# Patient Record
Sex: Female | Born: 1945 | Race: White | Hispanic: No | Marital: Married | Smoking: Former smoker
Health system: Southern US, Community
[De-identification: ages and names within clinical notes are randomized; demographics above are authoritative.]

## PROBLEM LIST (undated history)

## (undated) DIAGNOSIS — E7800 Pure hypercholesterolemia, unspecified: Secondary | ICD-10-CM

## (undated) DIAGNOSIS — C3490 Malignant neoplasm of unspecified part of unspecified bronchus or lung: Secondary | ICD-10-CM

## (undated) DIAGNOSIS — H409 Unspecified glaucoma: Secondary | ICD-10-CM

## (undated) DIAGNOSIS — I10 Essential (primary) hypertension: Secondary | ICD-10-CM

## (undated) HISTORY — PX: BREAST SURGERY: SHX581

## (undated) HISTORY — PX: HERNIA REPAIR: SHX51

---

## 1997-05-27 ENCOUNTER — Ambulatory Visit (HOSPITAL_COMMUNITY): Admission: RE | Admit: 1997-05-27 | Discharge: 1997-05-27 | Payer: Self-pay | Admitting: Family Medicine

## 1998-06-05 ENCOUNTER — Ambulatory Visit (HOSPITAL_COMMUNITY): Admission: RE | Admit: 1998-06-05 | Discharge: 1998-06-05 | Payer: Self-pay

## 1998-10-18 ENCOUNTER — Other Ambulatory Visit: Admission: RE | Admit: 1998-10-18 | Discharge: 1998-10-18 | Payer: Self-pay | Admitting: *Deleted

## 1999-07-11 ENCOUNTER — Ambulatory Visit (HOSPITAL_COMMUNITY): Admission: RE | Admit: 1999-07-11 | Discharge: 1999-07-11 | Payer: Self-pay | Admitting: Family Medicine

## 1999-07-11 ENCOUNTER — Encounter: Payer: Self-pay | Admitting: Family Medicine

## 2000-08-05 ENCOUNTER — Ambulatory Visit (HOSPITAL_COMMUNITY): Admission: RE | Admit: 2000-08-05 | Discharge: 2000-08-05 | Payer: Self-pay | Admitting: Family Medicine

## 2000-08-05 ENCOUNTER — Encounter: Payer: Self-pay | Admitting: Family Medicine

## 2001-08-07 ENCOUNTER — Encounter: Admission: RE | Admit: 2001-08-07 | Discharge: 2001-08-07 | Payer: Self-pay | Admitting: Family Medicine

## 2001-08-07 ENCOUNTER — Encounter: Payer: Self-pay | Admitting: Family Medicine

## 2001-09-10 ENCOUNTER — Ambulatory Visit (HOSPITAL_COMMUNITY): Admission: RE | Admit: 2001-09-10 | Discharge: 2001-09-10 | Payer: Self-pay | Admitting: Family Medicine

## 2001-09-10 ENCOUNTER — Encounter: Payer: Self-pay | Admitting: Family Medicine

## 2001-10-15 ENCOUNTER — Encounter: Admission: RE | Admit: 2001-10-15 | Discharge: 2001-10-15 | Payer: Self-pay | Admitting: Family Medicine

## 2001-10-15 ENCOUNTER — Encounter: Payer: Self-pay | Admitting: Family Medicine

## 2002-08-13 ENCOUNTER — Encounter: Admission: RE | Admit: 2002-08-13 | Discharge: 2002-08-13 | Payer: Self-pay | Admitting: Family Medicine

## 2002-08-13 ENCOUNTER — Encounter: Payer: Self-pay | Admitting: Family Medicine

## 2003-05-12 ENCOUNTER — Other Ambulatory Visit: Admission: RE | Admit: 2003-05-12 | Discharge: 2003-05-12 | Payer: Self-pay | Admitting: Internal Medicine

## 2003-06-29 ENCOUNTER — Ambulatory Visit (HOSPITAL_COMMUNITY): Admission: RE | Admit: 2003-06-29 | Discharge: 2003-06-29 | Payer: Self-pay | Admitting: Gastroenterology

## 2003-06-29 ENCOUNTER — Encounter (INDEPENDENT_AMBULATORY_CARE_PROVIDER_SITE_OTHER): Payer: Self-pay | Admitting: Specialist

## 2003-09-20 ENCOUNTER — Ambulatory Visit (HOSPITAL_COMMUNITY): Admission: RE | Admit: 2003-09-20 | Discharge: 2003-09-20 | Payer: Self-pay | Admitting: Internal Medicine

## 2004-09-25 ENCOUNTER — Ambulatory Visit (HOSPITAL_COMMUNITY): Admission: RE | Admit: 2004-09-25 | Discharge: 2004-09-25 | Payer: Self-pay | Admitting: Internal Medicine

## 2004-11-09 ENCOUNTER — Other Ambulatory Visit: Admission: RE | Admit: 2004-11-09 | Discharge: 2004-11-09 | Payer: Self-pay | Admitting: Internal Medicine

## 2004-12-12 ENCOUNTER — Ambulatory Visit (HOSPITAL_COMMUNITY): Admission: RE | Admit: 2004-12-12 | Discharge: 2004-12-12 | Payer: Self-pay | Admitting: Gastroenterology

## 2004-12-12 ENCOUNTER — Encounter (INDEPENDENT_AMBULATORY_CARE_PROVIDER_SITE_OTHER): Payer: Self-pay | Admitting: *Deleted

## 2007-08-27 ENCOUNTER — Encounter: Admission: RE | Admit: 2007-08-27 | Discharge: 2007-08-27 | Payer: Self-pay | Admitting: Surgery

## 2007-09-08 ENCOUNTER — Encounter: Admission: RE | Admit: 2007-09-08 | Discharge: 2007-09-08 | Payer: Self-pay | Admitting: Surgery

## 2009-12-07 ENCOUNTER — Ambulatory Visit (HOSPITAL_COMMUNITY): Admission: RE | Admit: 2009-12-07 | Discharge: 2009-12-07 | Payer: Self-pay | Admitting: Gastroenterology

## 2010-03-09 ENCOUNTER — Other Ambulatory Visit
Admission: RE | Admit: 2010-03-09 | Discharge: 2010-03-09 | Payer: Self-pay | Source: Home / Self Care | Admitting: Family Medicine

## 2010-07-20 NOTE — Op Note (Signed)
NAMEDALLANA, MAVITY                        ACCOUNT NO.:  [id number]   MEDICAL RECORD NO.:  [id number]                   PATIENT TYPE:  AMB   LOCATION:  ENDO                                 FACILITY:  [facility]   PHYSICIAN:  Danise Edge, M.D.                DATE OF BIRTH:  Oct 08, 1945   DATE OF PROCEDURE:  06/29/2003  DATE OF DISCHARGE:                                 OPERATIVE REPORT   PROCEDURE:  Colonoscopy with polypectomy.   INDICATIONS:  Mrs. Lori Park is a 65 year old female, born December 27, 1945.  Mrs. Lori Park is scheduled to undergo a screening colonoscopy  with polypectomy to prevent colon cancer.   ENDOSCOPIST:  Danise Edge, M.D.   PREMEDICATION:  Versed 5 mg, Demerol 50. Mg.   DESCRIPTION OF PROCEDURE:  After obtaining informed consent, Mrs. Call  was placed in the left lateral decubitus position.  I administered  intravenous Demerol and intravenous Versed to achieve conscious sedation for  the procedure.  The patient's blood pressure, oxygen saturation and cardiac  rhythm were monitored throughout the procedure and documented in the medical  record.   Anal inspection and digital rectal exam was normal.  The Olympus adjustable  pediatric colonoscope was introduced into the rectum and advanced to the  cecum.  Colonic preparation for the exam today was excellent.   Rectum and sigmoid colon:  Approximately eight 1-3 mm polyps were removed  from the mid sigmoid colon to the mid rectum and submitted in one bottle for  pathological evaluation.  Descending colon:  Normal.  Splenic flexure:  Normal.  Transverse colon:  Normal.  Hepatic flexure:  A 3 mm sessile polyp was removed with the electrocautery  snare.  Ascending colon:  Normal.  Cecum and ileocecal valve:  Small polyp was removed from the cecum with the  cold biopsy forceps and submitted for pathological evaluation.   ASSESSMENT:  Small polyps removed from cecum,  small polyp removed from  the  hepatic flexure, and small polyps removed from the mid sigmoid to mid  rectum.                                               Danise Edge, M.D.    MJ/MEDQ  D:  06/29/2003  T:  06/29/2003  Job:  [id number]   cc:   Sharlet Salina, M.D.  [address]  [place]  [place] [address]  Fax: [phone]

## 2010-07-20 NOTE — Op Note (Signed)
[name], [name]            ACCOUNT NO.:  [id number]   MEDICAL RECORD NO.:  [id number]          PATIENT TYPE:  AMB   LOCATION:  ENDO                         FACILITY:  [facility]   PHYSICIAN:  Danise Edge, M.D.   DATE OF BIRTH:  01-27-1946   DATE OF PROCEDURE:  12/12/2004  DATE OF DISCHARGE:                                 OPERATIVE REPORT   PROCEDURE:  Esophagogastroduodenoscopy, colonoscopy and polypectomy.   REFERRING PHYSICIAN:  Sharlet Salina, M.D.   PROCEDURE INDICATIONS:  Lori Park is a 65 year old female born  08/23/1945. Lori Park has unexplained guaiac-positive stool.  Approximately a year ago, she underwent a colonoscopy; several small  adenomatous polyps were removed.   Lori Park denies abdominal pain or gastrointestinal bleeding.   ENDOSCOPIST:  Danise Edge, M.D.   PREMEDICATION:  Versed 10 mg, Demerol 100 mg.   PROCEDURE:  DIAGNOSTIC ESOPHAGOGASTRODUODENOSCOPY:  After obtaining informed  consent, Lori Park was placed in the left lateral decubitus position. I  administered intravenous Demerol and intravenous Versed to achieve conscious  sedation for the procedure. The patient's blood pressure, oxygen saturation  and cardiac rhythm were monitored throughout the procedure and documented in  the medical record.   The Olympus gastroscope was passed through the posterior hypopharynx into  the proximal esophagus without difficulty. The hypopharynx, larynx and vocal  cords appeared normal.   ESOPHAGOSCOPY:  The proximal mid and lower segments of the esophageal mucosa  appear normal and intact.   GASTROSCOPY:  Retroflexed view of the gastric cardia and fundus was normal.  The gastric body, antrum and pylorus appear normal.   DUODENOSCOPY:  The duodenal bulb, second portion of duodenum and third  portion of duodenum appear normal.   ASSESSMENT:  Normal esophagogastroduodenoscopy.   PROCEDURE:  COLONOSCOPY WITH POLYPECTOMY:   Anal inspection and digital  rectal exam were normal. The Olympus adjustable pediatric colonoscope was  introduced into the rectum and advanced to the cecum. A normal-appearing  ileocecal valve was intubated and the distal ileum inspected. Colonic  preparation for the exam today was excellent.   RECTUM:  Normal. Retroflex view of the distal rectum normal.  SIGMOID COLON AND DESCENDING COLON:  At 25 cm from the anal verge, a 2-mm  sessile polyp was removed with the electrocautery snare.  SPLENIC FLEXURE:  Normal.  TRANSVERSE COLON:  Normal.  HEPATIC FLEXURE:  Normal.  ASCENDING COLON:  In the distal ascending colon, a lipomatous appearing  mucosal fold with intact mucosa was identified.  CECUM AND ILEOCECAL VALVE:  A 1-mm sessile polyp was removed from the  proximal cecum with cold biopsy forceps.  DISTAL ILEUM:  Normal.   ASSESSMENT:  1.  A diminutive polyp was removed from the proximal cecum with the cold      biopsy forceps.  2.  A small polyp was removed from the distal sigmoid colon with the      electrocautery snare.  3.  A lipomatous appearing fold with intact mucosa __________.   RECOMMENDATIONS:  I cannot explain Lori Park's guaiac-positive stool.  If she is anemic indicating significant gastrointestinal bleeding, I would  recommend scheduling her for a capsule enteroscopy. This can be scheduled by  calling the [facility] suit, [phone].           ______________________________  Danise Edge, M.D.     MJ/MEDQ  D:  12/12/2004  T:  12/12/2004  Job:  [id number]

## 2012-01-09 ENCOUNTER — Other Ambulatory Visit: Payer: Self-pay | Admitting: Family Medicine

## 2012-01-09 DIAGNOSIS — R229 Localized swelling, mass and lump, unspecified: Secondary | ICD-10-CM

## 2012-01-13 ENCOUNTER — Ambulatory Visit
Admission: RE | Admit: 2012-01-13 | Discharge: 2012-01-13 | Disposition: A | Discharge status: Home / Self Care | Payer: Medicare Other | Source: Ambulatory Visit | Attending: Family Medicine | Admitting: Family Medicine

## 2012-01-13 DIAGNOSIS — R229 Localized swelling, mass and lump, unspecified: Secondary | ICD-10-CM

## 2014-03-28 ENCOUNTER — Emergency Department (HOSPITAL_COMMUNITY): Payer: PPO

## 2014-03-28 ENCOUNTER — Inpatient Hospital Stay (HOSPITAL_COMMUNITY)
Admission: EM | Admit: 2014-03-28 | Discharge: 2014-03-31 | DRG: 166 | Disposition: A | Discharge status: Home Health | Payer: PPO | Attending: Internal Medicine | Admitting: Internal Medicine

## 2014-03-28 ENCOUNTER — Encounter (HOSPITAL_COMMUNITY): Payer: Self-pay | Admitting: *Deleted

## 2014-03-28 DIAGNOSIS — C3490 Malignant neoplasm of unspecified part of unspecified bronchus or lung: Secondary | ICD-10-CM

## 2014-03-28 DIAGNOSIS — Z9889 Other specified postprocedural states: Secondary | ICD-10-CM

## 2014-03-28 DIAGNOSIS — Z79899 Other long term (current) drug therapy: Secondary | ICD-10-CM

## 2014-03-28 DIAGNOSIS — R918 Other nonspecific abnormal finding of lung field: Secondary | ICD-10-CM | POA: Insufficient documentation

## 2014-03-28 DIAGNOSIS — R42 Dizziness and giddiness: Secondary | ICD-10-CM

## 2014-03-28 DIAGNOSIS — Z7982 Long term (current) use of aspirin: Secondary | ICD-10-CM

## 2014-03-28 DIAGNOSIS — C7931 Secondary malignant neoplasm of brain: Secondary | ICD-10-CM | POA: Diagnosis present

## 2014-03-28 DIAGNOSIS — C801 Malignant (primary) neoplasm, unspecified: Secondary | ICD-10-CM | POA: Diagnosis not present

## 2014-03-28 DIAGNOSIS — E785 Hyperlipidemia, unspecified: Secondary | ICD-10-CM | POA: Diagnosis present

## 2014-03-28 DIAGNOSIS — R911 Solitary pulmonary nodule: Secondary | ICD-10-CM | POA: Diagnosis not present

## 2014-03-28 DIAGNOSIS — I10 Essential (primary) hypertension: Secondary | ICD-10-CM | POA: Diagnosis present

## 2014-03-28 DIAGNOSIS — G936 Cerebral edema: Secondary | ICD-10-CM | POA: Diagnosis present

## 2014-03-28 DIAGNOSIS — Z800 Family history of malignant neoplasm of digestive organs: Secondary | ICD-10-CM | POA: Diagnosis not present

## 2014-03-28 DIAGNOSIS — Z510 Encounter for antineoplastic radiation therapy: Secondary | ICD-10-CM | POA: Diagnosis present

## 2014-03-28 DIAGNOSIS — Z87891 Personal history of nicotine dependence: Secondary | ICD-10-CM

## 2014-03-28 DIAGNOSIS — R262 Difficulty in walking, not elsewhere classified: Secondary | ICD-10-CM | POA: Diagnosis not present

## 2014-03-28 HISTORY — DX: Malignant neoplasm of unspecified part of unspecified bronchus or lung: C34.90

## 2014-03-28 HISTORY — DX: Pure hypercholesterolemia, unspecified: E78.00

## 2014-03-28 HISTORY — DX: Essential (primary) hypertension: I10

## 2014-03-28 LAB — CBC
HCT: 40.5 % (ref 36.0–46.0)
Hemoglobin: 13.5 g/dL (ref 12.0–15.0)
MCH: 34.5 pg — ABNORMAL HIGH (ref 26.0–34.0)
MCHC: 33.3 g/dL (ref 30.0–36.0)
MCV: 103.6 fL — ABNORMAL HIGH (ref 78.0–100.0)
Platelets: 349 10*3/uL (ref 150–400)
RBC: 3.91 MIL/uL (ref 3.87–5.11)
RDW: 12.8 % (ref 11.5–15.5)
WBC: 6.3 10*3/uL (ref 4.0–10.5)

## 2014-03-28 LAB — COMPREHENSIVE METABOLIC PANEL
ALBUMIN: 4.7 g/dL (ref 3.5–5.2)
ALK PHOS: 49 U/L (ref 39–117)
ALT: 29 U/L (ref 0–35)
AST: 33 U/L (ref 0–37)
Anion gap: 11 (ref 5–15)
BUN: 9 mg/dL (ref 6–23)
CALCIUM: 10.1 mg/dL (ref 8.4–10.5)
CHLORIDE: 99 mmol/L (ref 96–112)
CO2: 27 mmol/L (ref 19–32)
CREATININE: 0.8 mg/dL (ref 0.50–1.10)
GFR calc Af Amer: 85 mL/min — ABNORMAL LOW (ref >90)
GFR, EST NON AFRICAN AMERICAN: 74 mL/min — AB (ref >90)
GLUCOSE: 119 mg/dL — AB (ref 70–99)
Potassium: 3.6 mmol/L (ref 3.5–5.1)
Sodium: 137 mmol/L (ref 135–145)
Total Bilirubin: 1 mg/dL (ref 0.3–1.2)
Total Protein: 7.7 g/dL (ref 6.0–8.3)

## 2014-03-28 LAB — PROTIME-INR
INR: 0.99 (ref 0.00–1.49)
Prothrombin Time: 13.2 seconds (ref 11.6–15.2)

## 2014-03-28 LAB — DIFFERENTIAL
BASOS PCT: 1 % (ref 0–1)
Basophils Absolute: 0.1 10*3/uL (ref 0.0–0.1)
EOS ABS: 0.2 10*3/uL (ref 0.0–0.7)
EOS PCT: 3 % (ref 0–5)
LYMPHS ABS: 2.3 10*3/uL (ref 0.7–4.0)
LYMPHS PCT: 37 % (ref 12–46)
MONO ABS: 0.4 10*3/uL (ref 0.1–1.0)
MONOS PCT: 6 % (ref 3–12)
NEUTROS ABS: 3.3 10*3/uL (ref 1.7–7.7)
NEUTROS PCT: 53 % (ref 43–77)

## 2014-03-28 LAB — I-STAT TROPONIN, ED: Troponin i, poc: 0 ng/mL (ref 0.00–0.08)

## 2014-03-28 LAB — APTT: APTT: 28 s (ref 24–37)

## 2014-03-28 MED ORDER — GADOBENATE DIMEGLUMINE 529 MG/ML IV SOLN
15.0000 mL | Freq: Once | INTRAVENOUS | Status: AC | PRN
  Administered 2014-03-28: 15 mL via INTRAVENOUS

## 2014-03-28 MED ORDER — DEXAMETHASONE SODIUM PHOSPHATE 10 MG/ML IJ SOLN
10.0000 mg | Freq: Once | INTRAMUSCULAR | Status: AC
  Administered 2014-03-29: 10 mg via INTRAVENOUS
  Filled 2014-03-28: qty 1

## 2014-03-28 NOTE — ED Notes (Signed)
Pt reports onset of dizziness x 4-5 days ago, family also reports pt seems more "spacey" that normal and some difficulty with speech. At triage, grips are equal, no arm drift, no facial droop, speech clear.

## 2014-03-28 NOTE — Progress Notes (Signed)
Received pt report from [place].

## 2014-03-28 NOTE — ED Provider Notes (Signed)
CSN: [id number]     Arrival date & time 03/28/14  1848 History   First MD Initiated Contact with Patient 03/28/14 1958     Chief Complaint  Patient presents with  . Dizziness     (Consider location/radiation/quality/duration/timing/severity/associated sxs/prior Treatment) HPI Comments: Patient presents with a four-day history of dizziness. She feels a little bit lightheaded. There is no vertiginous-type symptoms. She's had trouble getting her words out. She states that she feels like she knows what she wants to say but has trouble forming her words. She states her balance has been off and she's been stumbling. She feels like her coordination is off. She denies any weakness on one side versus the other. She denies any vision changes. She denies any chest pain or shortness of breath. She denies any recent falls or head injuries. She has a history of hypertension and hyperlipidemia. She takes a baby aspirin daily but no other anticoagulants.  Patient is a 69 y.o. female presenting with dizziness.  Dizziness Associated symptoms: no blood in stool, no chest pain, no diarrhea, no headaches, no nausea, no shortness of breath and no vomiting     Past Medical History  Diagnosis Date  . Hypertension   . High cholesterol    History reviewed. No pertinent past surgical history. History reviewed. No pertinent family history. History  Substance Use Topics  . Smoking status: Not on file  . Smokeless tobacco: Not on file  . Alcohol Use: Yes   OB History    No data available     Review of Systems  Constitutional: Negative for fever, chills, diaphoresis and fatigue.  HENT: Negative for congestion, rhinorrhea and sneezing.   Eyes: Negative.   Respiratory: Negative for cough, chest tightness and shortness of breath.   Cardiovascular: Negative for chest pain and leg swelling.  Gastrointestinal: Negative for nausea, vomiting, abdominal pain, diarrhea and blood in stool.  Genitourinary: Negative  for frequency, hematuria, flank pain and difficulty urinating.  Musculoskeletal: Negative for back pain and arthralgias.  Skin: Negative for rash.  Neurological: Positive for dizziness and speech difficulty. Negative for weakness, numbness and headaches.      Allergies  Review of patient's allergies indicates no known allergies.  Home Medications   Prior to Admission medications   Medication Sig Start Date End Date Taking? Authorizing Provider  simvastatin (ZOCOR) 20 MG tablet Take 20 mg by mouth daily. 03/16/14  Yes Historical Provider, MD   BP 151/83 mmHg  Pulse 86  Temp(Src) 98 F (36.7 C) (Oral)  Resp 24  SpO2 95% Physical Exam  Constitutional: She is oriented to person, place, and time. She appears well-developed and well-nourished.  HENT:  Head: Normocephalic and atraumatic.  Eyes: Pupils are equal, round, and reactive to light.  Neck: Normal range of motion. Neck supple.  Cardiovascular: Normal rate, regular rhythm and normal heart sounds.   Pulmonary/Chest: Effort normal and breath sounds normal. No respiratory distress. She has no wheezes. She has no rales. She exhibits no tenderness.  Abdominal: Soft. Bowel sounds are normal. There is no tenderness. There is no rebound and no guarding.  Musculoskeletal: Normal range of motion. She exhibits no edema.  Lymphadenopathy:    She has no cervical adenopathy.  Neurological: She is alert and oriented to person, place, and time.  Motor 5 out of 5 all extremities other than the right lower extremity has a little bit of drift. Sensation grossly intact to light touch all extremities. Cranial nerves II through XII grossly intact. Visual  fields full to confrontation. No pronator drift. Finger to nose is slowed on the right.  Skin: Skin is warm and dry. No rash noted.  Psychiatric: She has a normal mood and affect.    ED Course  Procedures (including critical care time) Labs Review Labs Reviewed  CBC - Abnormal; Notable for the  following:    MCV 103.6 (*)    MCH 34.5 (*)    All other components within normal limits  COMPREHENSIVE METABOLIC PANEL - Abnormal; Notable for the following:    Glucose, Bld 119 (*)    GFR calc non Af Amer 74 (*)    GFR calc Af Amer 85 (*)    All other components within normal limits  PROTIME-INR  APTT  DIFFERENTIAL  I-STAT TROPOININ, ED    Imaging Review Ct Head Wo Contrast  03/28/2014   CLINICAL DATA:  Dizziness for 4-5 days, forgetfulness. History of hypertension and hyperlipidemia.  EXAM: CT HEAD WITHOUT CONTRAST  TECHNIQUE: Contiguous axial images were obtained from the base of the skull through the vertex without intravenous contrast.  COMPARISON:  None.  FINDINGS: Expansile vasogenic edema in LEFT upper lobe with mild mass effect on the LEFT lateral ventricle, no hydrocephalus. In addition, LEFT posterior frontal lobe encephalomalacia. Additional expansile hypodensities in the pons, RIGHT brachium pontis. Mild expansile hypodensity in RIGHT parietal lobe. No intraparenchymal hemorrhage. No acute large vascular territory infarct.  No abnormal extra-axial fluid collections. Basal cisterns are patent. Mild calcific atherosclerosis of the carotid siphons.  No skull fracture. The included ocular globes and orbital contents are non-suspicious. The mastoid aircells and included paranasal sinuses are well-aerated.  IMPRESSION: Multiple expansile hypodensities in the supra and infratentorial brain, largest in LEFT frontal lobe inferring underlying masses, concerning for metastatic disease (less likely infection) and would be better characterized on MRI of the brain with contrast.  Acute findings discussed with and reconfirmed by Dr.Tylon Kemmerling on 03/28/2014 at 9:40 pm.   Electronically Signed   By: Elon Alas   On: 03/28/2014 21:41     EKG Interpretation   Date/Time:  Monday March 28 2014 18:59:44 EST Ventricular Rate:  93 PR Interval:  154 QRS Duration: 80 QT Interval:  368 QTC  Calculation: 457 R Axis:   59 Text Interpretation:  Normal sinus rhythm Low voltage QRS Nonspecific ST  and T wave abnormality Abnormal ECG No old tracing to compare Confirmed by  Doyl Bitting  MD, Deward Sebek ([id number]) on 03/28/2014 8:40:09 PM      MDM   Final diagnoses:  Brain metastases    Patient presents with ataxia and coordination difficulties with some speech deficits. CT shows multiple brain metastases with a lower possibility of abscesses. MRI is pending. I discussed the findings with Dr. Posey Pronto who will admit the patient for further evaluation and workup.    Malvin Johns, MD 03/28/14 2312

## 2014-03-28 NOTE — H&P (Signed)
[facility] History and Physical  Patient: Lori Park  WUJ:[id number]  DOB: 1945-08-01  DOS: the patient was seen and examined on 03/28/2014 PCP: Shirline Frees, MD  Chief Complaint: Dizziness and lightheadedness  HPI: Lori Park is a 69 y.o. female with Past medical history of hypertension and dyslipidemia, motor disorder. The patient presented with complaints of dizziness and lightheadedness. She mentions that since last one month she has had a sense of "not feeling well".  a month ago she was seen in urgent care at which time she mention this to her urgent care provider. Since last 5 days she has been having complaints of dizziness. She initially noted the symptoms when she was trying to get out of the bed and was having difficulty maintaining balance. Over the weekend she felt better but then yesterday again she started having complaints of dizziness and lightheadedness. She was having difficulty remembering short-term events. There was no fall no trauma no staring episode no loss of consciousness no loss of bowel or bladder control no chest pain no shortness of breath no fever no chills no cough no congestion no nausea no vomiting no diarrhea no burning urination. There is no recent change in her medication. Patient is unable to verify whether she is taking Zoloft or Celexa.   The patient is coming from home. And at her baseline independent for most of her ADL.  Review of Systems: as mentioned in the history of present illness.  A Comprehensive review of the other systems is negative.  Past Medical History  Diagnosis Date  . Hypertension   . High cholesterol    History reviewed. No pertinent past surgical history. Social History:  reports that she drinks alcohol. She reports that she does not use illicit drugs. Her tobacco history is not on file.  No Known Allergies  History reviewed. No pertinent family history.  Prior to Admission medications    Medication Sig Start Date End Date Taking? Authorizing Provider  aspirin EC 81 MG tablet Take 81 mg by mouth at bedtime.   Yes Historical Provider, MD  Cholecalciferol (VITAMIN D3) 2000 UNITS TABS Take 2,000 Units by mouth daily.   Yes Historical Provider, MD  lisinopril (PRINIVIL,ZESTRIL) 10 MG tablet Take 10 mg by mouth every morning.   Yes Historical Provider, MD  niacin (NIASPAN) 1000 MG CR tablet Take 1,000 mg by mouth at bedtime.   Yes Historical Provider, MD  Omega-3 Fatty Acids (FISH OIL) 600 MG CAPS Take 1,200 mg by mouth 2 (two) times daily.   Yes Historical Provider, MD  Polyvinyl Alcohol-Povidone (REFRESH OP) Place 2 drops into both eyes daily as needed (dry eyes).   Yes Historical Provider, MD  Sertraline HCl (ZOLOFT PO) Take 20 mg by mouth daily.   Yes Historical Provider, MD  simvastatin (ZOCOR) 20 MG tablet Take 20 mg by mouth daily. 03/16/14  Yes Historical Provider, MD  vitamin E 400 UNIT capsule Take 400 Units by mouth daily.   Yes Historical Provider, MD    Physical Exam: Filed Vitals:   03/28/14 1945 03/28/14 1948 03/28/14 1953 03/28/14 2030  BP: 151/89 151/89  151/83  Pulse: 93 87  86  Temp:  98 F (36.7 C) 98 F (36.7 C)   TempSrc:  Oral    Resp: 18 18  24   SpO2: 97% 97%  95%    General: Alert, Awake and Oriented to Time, Place and Person. Appear in mild distress Eyes: PERRL ENT: Oral Mucosa clear moist. Neck: no JVD Cardiovascular:  S1 and S2 Present, no Murmur, Peripheral Pulses Present Respiratory: Bilateral Air entry equal and Decreased, Clear to Auscultation, noCrackles, no wheezes Abdomen: Bowel Sound present, Soft and no tender Skin: no Rash Extremities: no Pedal edema, no calf tenderness Neurologic: Difficulty with past pointing on the right otherwise unremarkable.   Labs on Admission:  CBC:  Recent Labs Lab 03/28/14 1900  WBC 6.3  NEUTROABS 3.3  HGB 13.5  HCT 40.5  MCV 103.6*  PLT 349    CMP     Component Value Date/Time   NA 137  03/28/2014 1900   K 3.6 03/28/2014 1900   CL 99 03/28/2014 1900   CO2 27 03/28/2014 1900   GLUCOSE 119* 03/28/2014 1900   BUN 9 03/28/2014 1900   CREATININE 0.80 03/28/2014 1900   CALCIUM 10.1 03/28/2014 1900   PROT 7.7 03/28/2014 1900   ALBUMIN 4.7 03/28/2014 1900   AST 33 03/28/2014 1900   ALT 29 03/28/2014 1900   ALKPHOS 49 03/28/2014 1900   BILITOT 1.0 03/28/2014 1900   GFRNONAA 74* 03/28/2014 1900   GFRAA 85* 03/28/2014 1900    No results for input(s): LIPASE, AMYLASE in the last 168 hours. No results for input(s): AMMONIA in the last 168 hours.  No results for input(s): CKTOTAL, CKMB, CKMBINDEX, TROPONINI in the last 168 hours. BNP (last 3 results) No results for input(s): PROBNP in the last 8760 hours.  Radiological Exams on Admission: Ct Head Wo Contrast  03/28/2014   CLINICAL DATA:  Dizziness for 4-5 days, forgetfulness. History of hypertension and hyperlipidemia.  EXAM: CT HEAD WITHOUT CONTRAST  TECHNIQUE: Contiguous axial images were obtained from the base of the skull through the vertex without intravenous contrast.  COMPARISON:  None.  FINDINGS: Expansile vasogenic edema in LEFT upper lobe with mild mass effect on the LEFT lateral ventricle, no hydrocephalus. In addition, LEFT posterior frontal lobe encephalomalacia. Additional expansile hypodensities in the pons, RIGHT brachium pontis. Mild expansile hypodensity in RIGHT parietal lobe. No intraparenchymal hemorrhage. No acute large vascular territory infarct.  No abnormal extra-axial fluid collections. Basal cisterns are patent. Mild calcific atherosclerosis of the carotid siphons.  No skull fracture. The included ocular globes and orbital contents are non-suspicious. The mastoid aircells and included paranasal sinuses are well-aerated.  IMPRESSION: Multiple expansile hypodensities in the supra and infratentorial brain, largest in LEFT frontal lobe inferring underlying masses, concerning for metastatic disease (less likely  infection) and would be better characterized on MRI of the brain with contrast.  Acute findings discussed with and reconfirmed by Dr.MELANIE BELFI on 03/28/2014 at 9:40 pm.   Electronically Signed   By: Elon Alas   On: 03/28/2014 21:41    EKG: Independently reviewed. normal sinus rhythm, nonspecific ST and T waves changes.  Assessment/Plan Principal Problem:   Brain metastases Active Problems:   Dizziness   Vasogenic cerebral edema   Family history of colon cancer   1. Brain metastases  Patient is presenting with compressive dizziness and lightheadedness with forgetfulness. Workup showed positive CT scan for vasogenic edema with suspicious metastatic lesions. MRI with and without contrast was positive and confirm entry. MRI also showed possible basilar artery mass effect on the left side due to edema and lesions. There are at least  4 lesions.  at present other than dizziness the patient does not have any acute complaint. Currently we do not have any primary tumor diagnosed for metastatic lesions to brain. Patient has history of former smoking one pack a day for 30 years but  smoking 8 years ago as well as has family history of colon cancer and anemia. Her mammogram October 15 was normal. She is due for her colonoscopy with prior colonoscopy showed polyps. She had a Pap smear when she was 87 and did not have any active vaginal bleeding. With this I would obtain CT chest abdomen and pelvis to rule out identify any primary etiology. Patient will remain nothing by mouth except medication overnight for possible procedure for possible biopsy depending on the results of the CT scan. For her brain metastasis I would provide her Decadron injection every 4 hours. Patient will be closely monitored on telemetry with seizure prophylaxis, fall prophylaxis, aspiration prophylaxis. I will obtain serial neuro checks PTOT consultation as well as speech evaluation.  2. disorder. At present holding  off on ordering medication until medication has been verified by husband.  Advance goals of care discussion: Full code   DVT Prophylaxis: subcutaneous Heparin Nutrition: Nothing by mouth except medication   Family Communication: husband  was present at bedside, opportunity was given to ask question and all questions were answered satisfactorily at the time of interview. Disposition: Admitted to inpatient in telemetry unit.  Author: Berle Mull, MD [facility] Pager: [phone] 03/28/2014, 11:40 PM    If 7PM-7AM, please contact night-coverage [email or web address] Password [email or web address]

## 2014-03-29 ENCOUNTER — Inpatient Hospital Stay (HOSPITAL_COMMUNITY): Payer: PPO

## 2014-03-29 ENCOUNTER — Ambulatory Visit: Payer: PPO | Admitting: Radiation Oncology

## 2014-03-29 ENCOUNTER — Encounter (HOSPITAL_COMMUNITY): Payer: Self-pay

## 2014-03-29 DIAGNOSIS — Z800 Family history of malignant neoplasm of digestive organs: Secondary | ICD-10-CM

## 2014-03-29 DIAGNOSIS — R262 Difficulty in walking, not elsewhere classified: Secondary | ICD-10-CM

## 2014-03-29 DIAGNOSIS — R42 Dizziness and giddiness: Secondary | ICD-10-CM

## 2014-03-29 DIAGNOSIS — G936 Cerebral edema: Secondary | ICD-10-CM | POA: Diagnosis present

## 2014-03-29 DIAGNOSIS — C7931 Secondary malignant neoplasm of brain: Secondary | ICD-10-CM | POA: Insufficient documentation

## 2014-03-29 MED ORDER — SIMVASTATIN 20 MG PO TABS
20.0000 mg | ORAL_TABLET | Freq: Every day | ORAL | Status: DC
  Administered 2014-03-29 – 2014-03-31 (×3): 20 mg via ORAL
  Filled 2014-03-29 (×3): qty 1

## 2014-03-29 MED ORDER — LISINOPRIL 10 MG PO TABS
10.0000 mg | ORAL_TABLET | Freq: Every day | ORAL | Status: DC
  Administered 2014-03-30 – 2014-03-31 (×2): 10 mg via ORAL
  Filled 2014-03-29 (×2): qty 1

## 2014-03-29 MED ORDER — DEXAMETHASONE SODIUM PHOSPHATE 4 MG/ML IJ SOLN
4.0000 mg | Freq: Four times a day (QID) | INTRAMUSCULAR | Status: DC
  Administered 2014-03-29 – 2014-03-31 (×10): 4 mg via INTRAVENOUS
  Filled 2014-03-29 (×10): qty 1

## 2014-03-29 MED ORDER — IOHEXOL 300 MG/ML  SOLN
25.0000 mL | INTRAMUSCULAR | Status: AC
  Administered 2014-03-29: 25 mL via ORAL

## 2014-03-29 MED ORDER — STROKE: EARLY STAGES OF RECOVERY BOOK
Freq: Once | Status: AC
  Administered 2014-03-29: 05:00:00
  Filled 2014-03-29: qty 1

## 2014-03-29 MED ORDER — SERTRALINE HCL 50 MG PO TABS
25.0000 mg | ORAL_TABLET | Freq: Every day | ORAL | Status: DC
  Administered 2014-03-29 – 2014-03-31 (×3): 25 mg via ORAL
  Filled 2014-03-29 (×3): qty 1

## 2014-03-29 MED ORDER — IOHEXOL 300 MG/ML  SOLN
100.0000 mL | Freq: Once | INTRAMUSCULAR | Status: AC | PRN
  Administered 2014-03-29: 100 mL via INTRAVENOUS

## 2014-03-29 MED ORDER — SODIUM CHLORIDE 0.9 % IV SOLN
INTRAVENOUS | Status: DC
  Administered 2014-03-29 (×2): via INTRAVENOUS

## 2014-03-29 NOTE — Progress Notes (Signed)
Patient arrived to 4N01 AAOx4 but pleasant and able to walk to the bed. [place]. Husband at the bedside. Call bell placed and bed alarm on. Vital signs taken. Will continue to monitor. Bryella Diviney, Rande Brunt

## 2014-03-29 NOTE — Evaluation (Signed)
Physical Therapy Evaluation Patient Details Name: Lori Park MRN: [id number] DOB: 1945/04/08 Today's Date: 03/29/2014   History of Present Illness  Patient presents with a four-day history of dizziness. She feels a little bit lightheaded. There is no vertiginous-type symptoms. She's had trouble getting her words out. She states that she feels like she knows what she wants to say but has trouble forming her words. She states her balance has been off and she's been stumbling. She feels like her coordination is off. 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  Clinical Impression  Pt admitted with above diagnosis. Pt currently with functional limitations due to the deficits listed below (see PT Problem List).  Pt will benefit from skilled PT to increase their independence and safety with mobility to allow discharge to the venue listed below.  Pt with decreased balance with mobility with slight posterior and Right sided lean.  Will follow patient acutely and recommend HHPT at d/c.      Follow Up Recommendations Home health PT    Equipment Recommendations  None recommended by PT (TBD)    Recommendations for Other Services       Precautions / Restrictions Precautions Precautions: Fall Restrictions Weight Bearing Restrictions: No      Mobility  Bed Mobility Overal bed mobility: Modified Independent                Transfers Overall transfer level: Needs assistance   Transfers: Sit to/from Stand Sit to Stand: Min assist         General transfer comment: upon standing pt with posterior lean with slight lean to right and relying on support from bed on back of legs.  Ambulation/Gait Ambulation/Gait assistance: Min assist   Assistive device: None Gait Pattern/deviations: Decreased step length - right;Decreased step length - left;Drifts right/left;Narrow base of support;Leaning posteriorly     General Gait Details:  Initially ambulating while pushing IV pole, but this was difficult for pt to process and she was very focused on it.  PT then took IV pole and she used R rail and did better.  When turned around, she ambulated without rail with slight drift to right.  Stairs            Wheelchair Mobility    Modified Rankin (Stroke Patients Only)       Balance Overall balance assessment: Needs assistance Sitting-balance support: Feet supported Sitting balance-Leahy Scale: Fair   Postural control: Posterior lean;Right lateral lean Standing balance support: No upper extremity supported Standing balance-Leahy Scale: Poor Standing balance comment: slight lean posteriorly and to the R.  Worked on proprioceptive training on midline stance.                             Pertinent Vitals/Pain Pain Assessment: No/denies pain    Home Living Family/patient expects to be discharged to:: Private residence Living Arrangements: Spouse/significant other   Type of Home: House       Home Layout: Two level Home Equipment: None      Prior Function Level of Independence: Independent         Comments: retired     Engineer, [employer]   Dominant Hand: Left    Extremity/Trunk Assessment   Upper Extremity Assessment: Overall WFL for tasks assessed           Lower Extremity Assessment: Overall WFL for tasks assessed (heel to shin intact and sensation intact  to light touch)      Cervical / Trunk Assessment: Normal  Communication   Communication: No difficulties  Cognition Arousal/Alertness: Awake/alert Behavior During Therapy: WFL for tasks assessed/performed Overall Cognitive Status: Impaired/Different from baseline Area of Impairment: Problem solving;Following commands       Following Commands: Follows multi-step commands with increased time     Problem Solving: Slow processing;Decreased initiation;Requires verbal cues General Comments: Pt very pleasant and taking  increased time to answer questions and to make decisions.    General Comments General comments (skin integrity, edema, etc.): niece present and very supportive.  Daughter coming in from [place].    Exercises        Assessment/Plan    PT Assessment Patient needs continued PT services  PT Diagnosis Difficulty walking   PT Problem List Decreased balance;Decreased mobility;Decreased coordination;Decreased cognition  PT Treatment Interventions Functional mobility training;Therapeutic activities;Therapeutic exercise;Gait training;Stair training;Balance training;Neuromuscular re-education;Patient/family education   PT Goals (Current goals can be found in the Care Plan section) Acute Rehab PT Goals Patient Stated Goal: To get better balance PT Goal Formulation: With patient/family Time For Goal Achievement: 04/12/14 Potential to Achieve Goals: Good    Frequency Min 4X/week   Barriers to discharge        Co-evaluation               End of Session Equipment Utilized During Treatment: Gait belt Activity Tolerance: Patient tolerated treatment well Patient left: in chair;with family/visitor present;with nursing/sitter in room;with call bell/phone within reach Nurse Communication: Mobility status         Time: 1003-1040 PT Time Calculation (min) (ACUTE ONLY): 37 min   Charges:   PT Evaluation $Initial PT Evaluation Tier I: 1 Procedure PT Treatments $Gait Training: 8-22 mins   PT G Codes:        Virdell Hoiland LUBECK 03/29/2014, 11:23 AM

## 2014-03-29 NOTE — Progress Notes (Signed)
UR complete.  Laurinda Carreno RN, MSN 

## 2014-03-29 NOTE — Progress Notes (Signed)
Per Patient's husband, PCP switched her zoloft to celexa 20mg . He also updated with RN that patient is taking lisinopril 10mg , zocor 20mg  along with celexa 20 mg every AM. Pharmacy called and updated.   Ave Filter, RN

## 2014-03-29 NOTE — Consult Note (Signed)
[facility]  Telephone:([phone] [facility] NOTE  Lori Park                                MR#: [id number]  DOB: 1945/07/30                       CSN#: [id number]  Referring MD: Dr. Sheliah Plane [facility]  Primary MD: Shirline Frees, MD  Reason for Consult: Lung Cancer with brain Metastases   AVW:PVXYI Lori Park is a 69 y.o. female admitted on 1/25 with 5 day history of dizziness and gait instability, as well as short term memory dysfunction. There was no reported trauma, loss of bowel or urinary control, seizure activity or vision changes. She denied any nausea or vomiting. No appetite changes.These symptoms postceded a recent upper respiratory infection treated with antibiotics. Currently she denies any shortness of breath or cough, or hemoptysis. She denies any chest pain or palpitations.  She denies any bleeding issues.   CT of the head without contrast on 1/25 revealed Multiple expansile hypodensities in the supra and infratentorial brain, largest in Left frontal lobe inferring underlying masses, concerning for metastatic disease. Expansile vasogenic edema in Left upper lobe with mild mass effect on the Left lateral ventricle was seen. MRI of the head on 1/25 confirmed  least 4 infratentorial and greater than 14 supratentorial cystic metastasis. Surrounding vasogenic edema, with pontine edema. No midline shift. CT of the chest, abdomen and pelvis with contrast on 1/26 showed a  4.8 x 4.5 cm mass in the superior segment right lower lung likely represents primary neoplasm, along with additional small nodular ground glass lesions. A probable mass in the superior segment right lower lung was seen. Diffuse fatty infiltration of the liver was noted. No evidence of metastatic disease in the abdomen or pelvis. Bronchoscopy is planned for 03/29/14 for tissue diagnosis. Lung primary is suspected as she is a  former smoker, quitting 8 years ago.       PMH:  Past Medical History  Diagnosis Date  . Hypertension   . High cholesterol     Surgeries:  History reviewed. No pertinent past surgical history.  Allergies: No Known Allergies  Medications:   Scheduled Meds: . dexamethasone  4 mg Intravenous 4 times per day  . sertraline  25 mg Oral Daily  . simvastatin  20 mg Oral Daily   Continuous Infusions: . sodium chloride 50 mL/hr at 03/29/14 0516     ROS: Constitutional: Denies fevers, chills or abnormal night sweats Eyes: Denies blurriness of vision, double vision or watery eyes Ears, nose, mouth, throat, and face: Denies mucositis or sore throat Respiratory: Denies cough, dyspnea or wheezes at this time. She has had a recent URI in December 2015 treated with antibiotics Cardiovascular: Denies palpitation, chest discomfort or lower extremity swelling Gastrointestinal:  Denies nausea, heartburn or change in bowel habits Skin: Denies abnormal skin rashes Lymphatics: Denies new lymphadenopathy or easy bruising Neurological:Denies numbness, tingling or new weaknesses. She has experienced dizziness and gait instability along with memory deficit over the last 5 days prior to admission, improving with Decadron.  Behavioral/Psych: Mood is stable, no new changes  All other systems were reviewed with the patient and are negative.  Health maintenance: Mammogram 12/2013, normal  Colonoscopy 12/08/09 positive for benign polyps                                      EGD on 12/12/04 normal                                      Last PAP age 81, normal.  Family History:   History of colon cancer infather. No other history of cancer in the family. Strong family history of COPD  Social History  She is a former smoker, 1 ppd for 30 years, quit in 2008. Drinks about "5 screwdrivers a day " Married. Lives in [place]. 1 daughter in good health.   Physical Exam   Filed Vitals:   03/29/14 0851  BP:  158/94  Pulse: 93  Temp: 97.4 F (36.3 C)  Resp: 18   Filed Weights   03/29/14 0228  Weight: 171 lb (77.565 kg)    GENERAL:alert, no distress and comfortable SKIN: skin color, texture, turgor are normal, no rashes or significant lesions EYES: normal, conjunctiva are pink and non-injected, sclera clear OROPHARYNX:no exudate, no erythema and lips, buccal mucosa, and tongue normal  NECK: supple, thyroid normal size, non-tender, without nodularity LYMPH:  no palpable lymphadenopathy in the cervical, axillary or inguinal LUNGS: clear to auscultation and percussion with normal breathing effort HEART: regular rate & rhythm and no murmurs and no lower extremity edema ABDOMEN:abdomen soft, non-tender and normal bowel sounds Musculoskeletal:no cyanosis of digits and no clubbing  PSYCH: alert & oriented x 3 with fluent speech. Gait is not tested. NEURO: no focal motor/sensory deficits except for right finger to nose discoordination  CBC  Recent Labs Lab 03/28/14 1900  WBC 6.3  HGB 13.5  HCT 40.5  PLT 349  MCV 103.6*  MCH 34.5*  MCHC 33.3  RDW 12.8  LYMPHSABS 2.3  MONOABS 0.4  EOSABS 0.2  BASOSABS 0.1    Anemia panel:  No results for input(s): VITAMINB12, FOLATE, FERRITIN, TIBC, IRON, RETICCTPCT in the last 72 hours.  CMP    Recent Labs Lab 03/28/14 1900  NA 137  K 3.6  CL 99  CO2 27  GLUCOSE 119*  BUN 9  CREATININE 0.80  CALCIUM 10.1  AST 33  ALT 29  ALKPHOS 49  BILITOT 1.0        Component Value Date/Time   BILITOT 1.0 03/28/2014 1900      Recent Labs Lab 03/28/14 1900  INR 0.99    No results for input(s): DDIMER in the last 72 hours.  Imaging Studies:  Ct Head Wo Contrast  03/28/2014  COMPARISON:  None.  FINDINGS: Expansile vasogenic edema in LEFT upper lobe with mild mass effect on the LEFT lateral ventricle, no hydrocephalus. In addition, LEFT posterior frontal lobe encephalomalacia. Additional expansile hypodensities in the pons, RIGHT  brachium pontis. Mild expansile hypodensity in RIGHT parietal lobe. No intraparenchymal hemorrhage. No acute large vascular territory infarct.  No abnormal extra-axial fluid collections. Basal cisterns are patent. Mild calcific atherosclerosis of the carotid siphons.  No skull fracture. The included ocular globes and orbital contents are non-suspicious. The mastoid aircells and included paranasal sinuses are well-aerated.  IMPRESSION: Multiple expansile hypodensities in the supra and infratentorial brain, largest in LEFT frontal lobe inferring underlying masses, concerning for metastatic disease (less likely infection) and would be better characterized  on MRI of the brain with contrast.  Acute findings discussed with and reconfirmed by Dr.MELANIE BELFI on 03/28/2014 at 9:40 pm.   Electronically Signed   By: Elon Alas   On: 03/28/2014 21:41   Ct Chest W Contrast  03/29/2014   COMPARISON:  CT chest 09/08/2007  FINDINGS: CT CHEST FINDINGS  Normal heart size. Normal caliber thoracic aorta. Great vessel origins are patent. Calcification of the aorta and Coronary arteries. Esophagus is decompressed.  Mass in the superior segment of the right lower lung with air bronchograms. The mass lesion measures about 4.8 x 4.5 cm. Prominent right hilar lymph node measuring 12 mm is probably metastatic. This likely represents primary lung cancer. Dominant metastasis not entirely excluded. There are vague nodular ground-glass lesions demonstrated both lungs, measuring less than 1 cm diameter. These are likely metastatic. Focal areas scarring in the right middle lung is unchanged since prior study. No pneumothorax. No pleural effusions. Airways appear patent. IMPRESSION: 4.8 cm mass in the superior segment right lower lung likely represents primary neoplasm. Additional small nodular ground-glass lesions in both lungs may represent metastasis. Probable right hilar lymph node metastasis.  Diffuse fatty infiltration of the liver.  No evidence of metastatic disease in the abdomen or pelvis.   Electronically Signed   By: Lucienne Capers M.D.   On: 03/29/2014 02:04   Mr Jeri Cos XN Contrast  COMPARISON:  CT of the head March 28, 2014 at 2116 hr  FINDINGS: No reduced diffusion to suggest acute ischemia, hypercellular large tumor or purulent material. No susceptibility artifact to suggest hemorrhage.  At least 4 infratentorially greater than 14 supratentorial intermediate FLAIR signal, T2 hyperintense peripherally enhancing cystic metastasis seen at the gray-white matter junction. Surrounding T2 bright vasogenic edema, most apparent within the LEFT frontal lobe with sulcal effacement, no midline shift. Mild T2 bright vasogenic edema within the pons partially effaces the pre pontine cistern, with mild mass effect on the basilar artery. Mild white matter changes of exclusive of the aforementioned bowel abnormality most consistent chronic small vessel ischemic disease.  Small remote RIGHT cerebellar infarcts. No abnormal extra-axial fluid collections. No suspicious dural, leptomeningeal enhancement. No extra-axial masses. Major intracranial vascular flow voids are preserved at the skullbase.  No paranasal sinus air-fluid levels. Trace fluid signal in the RIGHT mastoid air cells.  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 Surrounding vasogenic edema, with pontine edema resulting in mild pre pontine cistern effacement. No midline shift.  Remote small RIGHT cerebellar infarcts.   Electronically Signed   By: Elon Alas   On: 03/28/2014 23:42   Ct Abdomen Pelvis W Contrast  03/29/2014    COMPARISON:  CT chest 09/08/2007  FINDINGS:  CT ABDOMEN AND PELVIS FINDINGS  Diffuse fatty infiltration of the liver. No focal liver lesions appreciated. The gallbladder, pancreas, spleen, adrenal glands, kidneys, inferior vena cava, and retroperitoneal lymph nodes are unremarkable. Calcification of the  abdominal aorta without aneurysm. Small accessory spleen. Stomach, small bowel, and colon appear normal for degree of distention. Contrast material flows through to the colon without evidence of bowel obstruction. No free air or free fluid in the abdomen.  Pelvis: Diverticulosis of the sigmoid colon without evidence of diverticulitis. Bladder wall is not thickened. Uterus and ovaries are not enlarged. Appendix is normal. No free or loculated pelvic fluid collections. No pelvic mass or lymphadenopathy. Bones: Degenerative changes in the thoracic and lumbar spine with normal alignment. No destructive bone lesions are identified.  IMPRESSION:  Diffuse fatty infiltration of the liver. No evidence of metastatic disease in the abdomen or pelvis.   Electronically Signed   By: Lucienne Capers M.D.   On: 03/29/2014 02:04    A/P: 69 y.o. female with  Metastatic brain Lesions Lung masses MRI of the head on 1/25 shows at least 4 infratentorial and greater than 14 supratentorial cystic metastasis. Surrounding vasogenic edema, with pontine edema. No midline shift. CT of the chest, abdomen and pelvis with contrast on 1/26 showed a  4.8 x 4.5 cm lung mass along with additional small nodular ground glass lesions. A probable mass in the superior segment right lower lung was seen. No evidence of metastatic disease in the abdomen or pelvis. Bronchoscopy is planned for 03/29/14 for tissue diagnosis. Lung primary is suspected.  We were asked to see the patient in consultation while workup is undergoing, anticipating lung  malignancy due to her prior history of tobacco abuse.  Will await for the pathology report. Will proceed with recommendations once the result becomes available.  Radiation Oncology consultation while in hospital is recommended. If discharged, appointment was made with Dr. Pablo Ledger on March 31, 2014 at 1315 pm  Dizziness Gait instability Secondary to #1  DVT prophylaxis On Heparin  Family History  of Colon Cancer Last colonoscopy in 2011 was negative for malignancy. She has a history of benign colon polyps Her EGD was normal as of 2006  Full Code    Rondel Jumbo, PA-C 03/29/2014 1:05 PM  Mrs. Muff was interviewed and examined.  Cts/Brain Mri reviewed.  She appears to have metastatic lung cancer.  We will wait on the final pathology from the bronchoscopy to make treatment recommendations.  She could have small cell  Or non-small cell carcinoma. She does no appear to be a candidate for a brain metastectomy procedure or SRS. Dr. Pablo Ledger has been consulted.  Recommendations.  1. Proceed with diagnostic bronchoscopy 2. Continue decadron, can change to po regimen at discharge, start at 8mg  bid and taper to 4mg  bid after 5 days, further taper per Dr. Pablo Ledger. 3. Outpatient f/u at [facility] will be scheduled next 2 weeks 4. Please call Oncology as needed.

## 2014-03-29 NOTE — Progress Notes (Signed)
Dr. Nelda Marseille at bedside with patient and family with plans for bronch in the AM. MD ok for patient to have regular diet at this time. Will resume NPO at midnight. Patient/family update with information. Bronch info provided.   Ave Filter, RN

## 2014-03-29 NOTE — Progress Notes (Addendum)
Per Santiago Glad from [facility], Patient with schedule appointment with Dr. Pablo Ledger at [facility] on March 31, 2014 at 1315. Patient/family updated with information.  Ave Filter, RN

## 2014-03-29 NOTE — Consult Note (Signed)
Name: Lori Park MRN: [id number] DOB: 05/15/1945    ADMISSION DATE:  03/28/2014 CONSULTATION DATE:  03/29/2014  REFERRING MD :  TRH  CHIEF COMPLAINT:  Lung mass  BRIEF PATIENT DESCRIPTION: 69 year old female with history of HTN and dyslipidemia presents to the hospital with dizziness and light headedness.  Has not been feeling well x1 month now has been dizzy for 5 days.  Neurologically also had memory deficit but no fever, chills, N/V, hemoptysis or sputum production.  CT of the chest was done that showed multiple lung masses with one large one.  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  CT guided biopsy recommended by oncology but IR recommended evaluation by PCCM prior to procedure due to high risk of bleeding.  SIGNIFICANT EVENTS  1/26 PCCM consulted for ?bronchoscopy.  PAST MEDICAL HISTORY :   has a past medical history of Hypertension and High cholesterol.  has no past surgical history on file. Prior to Admission medications   Medication Sig Start Date End Date Taking? Authorizing Provider  aspirin EC 81 MG tablet Take 81 mg by mouth at bedtime.   Yes Historical Provider, MD  Cholecalciferol (VITAMIN D3) 2000 UNITS TABS Take 2,000 Units by mouth daily.   Yes Historical Provider, MD  niacin (NIASPAN) 1000 MG CR tablet Take 1,000 mg by mouth at bedtime.   Yes Historical Provider, MD  Omega-3 Fatty Acids (FISH OIL) 600 MG CAPS Take 1,200 mg by mouth 2 (two) times daily.   Yes Historical Provider, MD  Polyvinyl Alcohol-Povidone (REFRESH OP) Place 2 drops into both eyes daily as needed (dry eyes).   Yes Historical Provider, MD  Sertraline HCl (ZOLOFT PO) Take 20 mg by mouth daily.   Yes Historical Provider, MD  simvastatin (ZOCOR) 20 MG tablet Take 20 mg by mouth daily. 03/16/14  Yes Historical Provider, MD  vitamin E 400 UNIT capsule Take 400 Units by mouth daily.   Yes Historical Provider, MD   No Known  Allergies  FAMILY HISTORY:  family history is not on file. SOCIAL HISTORY:  reports that she quit smoking about 10 years ago. Her smoking use included Cigarettes. She does not have any smokeless tobacco history on file. She reports that she drinks alcohol. She reports that she does not use illicit drugs.  REVIEW OF SYSTEMS:   Constitutional: Negative for fever, chills, weight loss, malaise/fatigue and diaphoresis.  HENT: Negative for hearing loss, ear pain, nosebleeds, congestion, sore throat, neck pain, tinnitus and ear discharge.   Eyes: Negative for blurred vision, double vision, photophobia, pain, discharge and redness.  Respiratory: Negative for cough, hemoptysis, sputum production, shortness of breath, wheezing and stridor.   Cardiovascular: Negative for chest pain, palpitations, orthopnea, claudication, leg swelling and PND.  Gastrointestinal: Negative for heartburn, nausea, vomiting, abdominal pain, diarrhea, constipation, blood in stool and melena.  Genitourinary: Negative for dysuria, urgency, frequency, hematuria and flank pain.  Musculoskeletal: Negative for myalgias, back pain, joint pain and falls.  Skin: Negative for itching and rash.  Neurological: Negative for dizziness, tingling, tremors, sensory change, speech change, focal weakness, seizures, loss of consciousness, weakness and headaches.  Endo/Heme/Allergies: Negative for environmental allergies and polydipsia. Does not bruise/bleed easily.  Bold is positive on ROS.  SUBJECTIVE:   VITAL SIGNS: Temp:  [97.4 F (36.3 C)-98.3 F (36.8 C)] 97.4 F (36.3 C) (01/26 0851) Pulse Rate:  [81-109] 93 (01/26 0851) Resp:  [16-24] 18 (01/26 0851) BP: (139-177)/(83-119) 158/94 mmHg (01/26 0851)  SpO2:  [93 %-97 %] 94 % (01/26 0851) Weight:  [77.565 kg (171 lb)] 77.565 kg (171 lb) (01/26 0228)  PHYSICAL EXAMINATION: General:  Well appearing female, NAD. Neuro:  Alert and interactive, mild tremor. HEENT:  [place]/AT, PERRL, EOM-I  and MMM. Cardiovascular:  RRR, Nl S1/S2, -M/R/G. Lungs:  CTA bilaterally. Abdomen:  Soft, NT, ND and +BS. Musculoskeletal:  -edema and -tenderness. Skin:  Intact.   Recent Labs Lab 03/28/14 1900  NA 137  K 3.6  CL 99  CO2 27  BUN 9  CREATININE 0.80  GLUCOSE 119*    Recent Labs Lab 03/28/14 1900  HGB 13.5  HCT 40.5  WBC 6.3  PLT 349   Ct Head Wo Contrast  03/28/2014   CLINICAL DATA:  Dizziness for 4-5 days, forgetfulness. History of hypertension and hyperlipidemia.  EXAM: CT HEAD WITHOUT CONTRAST  TECHNIQUE: Contiguous axial images were obtained from the base of the skull through the vertex without intravenous contrast.  COMPARISON:  None.  FINDINGS: Expansile vasogenic edema in LEFT upper lobe with mild mass effect on the LEFT lateral ventricle, no hydrocephalus. In addition, LEFT posterior frontal lobe encephalomalacia. Additional expansile hypodensities in the pons, RIGHT brachium pontis. Mild expansile hypodensity in RIGHT parietal lobe. No intraparenchymal hemorrhage. No acute large vascular territory infarct.  No abnormal extra-axial fluid collections. Basal cisterns are patent. Mild calcific atherosclerosis of the carotid siphons.  No skull fracture. The included ocular globes and orbital contents are non-suspicious. The mastoid aircells and included paranasal sinuses are well-aerated.  IMPRESSION: Multiple expansile hypodensities in the supra and infratentorial brain, largest in LEFT frontal lobe inferring underlying masses, concerning for metastatic disease (less likely infection) and would be better characterized on MRI of the brain with contrast.  Acute findings discussed with and reconfirmed by Dr.MELANIE BELFI on 03/28/2014 at 9:40 pm.   Electronically Signed   By: Elon Alas   On: 03/28/2014 21:41   Ct Chest W Contrast  03/29/2014   CLINICAL DATA:  Brain metastases.  Evaluate for primary source.  EXAM: CT CHEST, ABDOMEN, AND PELVIS WITH CONTRAST  TECHNIQUE:  Multidetector CT imaging of the chest, abdomen and pelvis was performed following the standard protocol during bolus administration of intravenous contrast.  CONTRAST:  14mL OMNIPAQUE IOHEXOL 300 MG/ML  SOLN  COMPARISON:  CT chest 09/08/2007  FINDINGS: CT CHEST FINDINGS  Normal heart size. Normal caliber thoracic aorta. Great vessel origins are patent. Calcification of the aorta and Coronary arteries. Esophagus is decompressed.  Mass in the superior segment of the right lower lung with air bronchograms. The mass lesion measures about 4.8 x 4.5 cm. Prominent right hilar lymph node measuring 12 mm is probably metastatic. This likely represents primary lung cancer. Dominant metastasis not entirely excluded. There are vague nodular ground-glass lesions demonstrated both lungs, measuring less than 1 cm diameter. These are likely metastatic. Focal areas scarring in the right middle lung is unchanged since prior study. No pneumothorax. No pleural effusions. Airways appear patent.  CT ABDOMEN AND PELVIS FINDINGS  Diffuse fatty infiltration of the liver. No focal liver lesions appreciated. The gallbladder, pancreas, spleen, adrenal glands, kidneys, inferior vena cava, and retroperitoneal lymph nodes are unremarkable. Calcification of the abdominal aorta without aneurysm. Small accessory spleen. Stomach, small bowel, and colon appear normal for degree of distention. Contrast material flows through to the colon without evidence of bowel obstruction. No free air or free fluid in the abdomen.  Pelvis: Diverticulosis of the sigmoid colon without evidence of diverticulitis. Bladder wall  is not thickened. Uterus and ovaries are not enlarged. Appendix is normal. No free or loculated pelvic fluid collections. No pelvic mass or lymphadenopathy. Bones: Degenerative changes in the thoracic and lumbar spine with normal alignment. No destructive bone lesions are identified.  IMPRESSION: 4.8 cm mass in the superior segment right lower  lung likely represents primary neoplasm. Additional small nodular ground-glass lesions in both lungs may represent metastasis. Probable right hilar lymph node metastasis.  Diffuse fatty infiltration of the liver. No evidence of metastatic disease in the abdomen or pelvis.   Electronically Signed   By: Lucienne Capers M.D.   On: 03/29/2014 02:04   Mr Jeri Cos WC Contrast  03/28/2014   CLINICAL DATA:  Four-day history of dizziness, lightheadedness, speech difficulties. Gait instability.  EXAM: MRI HEAD WITHOUT AND WITH CONTRAST  TECHNIQUE: Multiplanar, multiecho pulse sequences of the brain and surrounding structures were obtained without and with intravenous contrast.  CONTRAST:  75mL MULTIHANCE GADOBENATE DIMEGLUMINE 529 MG/ML IV SOLN  COMPARISON:  CT of the head March 28, 2014 at 2116 hr  FINDINGS: No reduced diffusion to suggest acute ischemia, hypercellular large tumor or purulent material. No susceptibility artifact to suggest hemorrhage.  At least 4 infratentorially greater than 14 supratentorial intermediate FLAIR signal, T2 hyperintense peripherally enhancing cystic metastasis seen at the gray-white matter junction. Surrounding T2 bright vasogenic edema, most apparent within the LEFT frontal lobe with sulcal effacement, no midline shift. Mild T2 bright vasogenic edema within the pons partially effaces the pre pontine cistern, with mild mass effect on the basilar artery. Mild white matter changes of exclusive of the aforementioned bowel abnormality most consistent chronic small vessel ischemic disease.  Small remote RIGHT cerebellar infarcts. No abnormal extra-axial fluid collections. No suspicious dural, leptomeningeal enhancement. No extra-axial masses. Major intracranial vascular flow voids are preserved at the skullbase.  No paranasal sinus air-fluid levels. Trace fluid signal in the RIGHT mastoid air cells.  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  Surrounding vasogenic edema, with pontine edema resulting in mild pre pontine cistern effacement. No midline shift.  Remote small RIGHT cerebellar infarcts.   Electronically Signed   By: Elon Alas   On: 03/28/2014 23:42   Ct Abdomen Pelvis W Contrast  03/29/2014   CLINICAL DATA:  Brain metastases.  Evaluate for primary source.  EXAM: CT CHEST, ABDOMEN, AND PELVIS WITH CONTRAST  TECHNIQUE: Multidetector CT imaging of the chest, abdomen and pelvis was performed following the standard protocol during bolus administration of intravenous contrast.  CONTRAST:  136mL OMNIPAQUE IOHEXOL 300 MG/ML  SOLN  COMPARISON:  CT chest 09/08/2007  FINDINGS: CT CHEST FINDINGS  Normal heart size. Normal caliber thoracic aorta. Great vessel origins are patent. Calcification of the aorta and Coronary arteries. Esophagus is decompressed.  Mass in the superior segment of the right lower lung with air bronchograms. The mass lesion measures about 4.8 x 4.5 cm. Prominent right hilar lymph node measuring 12 mm is probably metastatic. This likely represents primary lung cancer. Dominant metastasis not entirely excluded. There are vague nodular ground-glass lesions demonstrated both lungs, measuring less than 1 cm diameter. These are likely metastatic. Focal areas scarring in the right middle lung is unchanged since prior study. No pneumothorax. No pleural effusions. Airways appear patent.  CT ABDOMEN AND PELVIS FINDINGS  Diffuse fatty infiltration of the liver. No focal liver lesions appreciated. The gallbladder, pancreas, spleen, adrenal glands, kidneys, inferior vena cava, and retroperitoneal lymph nodes are unremarkable. Calcification of the  abdominal aorta without aneurysm. Small accessory spleen. Stomach, small bowel, and colon appear normal for degree of distention. Contrast material flows through to the colon without evidence of bowel obstruction. No free air or free fluid in the abdomen.  Pelvis: Diverticulosis of the sigmoid  colon without evidence of diverticulitis. Bladder wall is not thickened. Uterus and ovaries are not enlarged. Appendix is normal. No free or loculated pelvic fluid collections. No pelvic mass or lymphadenopathy. Bones: Degenerative changes in the thoracic and lumbar spine with normal alignment. No destructive bone lesions are identified.  IMPRESSION: 4.8 cm mass in the superior segment right lower lung likely represents primary neoplasm. Additional small nodular ground-glass lesions in both lungs may represent metastasis. Probable right hilar lymph node metastasis.  Diffuse fatty infiltration of the liver. No evidence of metastatic disease in the abdomen or pelvis.   Electronically Signed   By: Lucienne Capers M.D.   On: 03/29/2014 02:04    ASSESSMENT / PLAN:  69 year old female with a large lung mass who presents with neurologic symptoms and multiple brain masses as above.  Given history, lung primary is most likely with metastasis.  I have discussed all options with patient at length (30 minutes).  Options were CT guided biopsy (which IR was less than enthused about), electromagnetic navigation (which the patient opted to avoid general anesthesia) vs conventional bronch under flouro.  After discussion, decision was made to proceed with conventional bronch under flouro as the least invasive option.  If negative then EMB.  Will make NPO after midnight, check coags in the morning and proceed at 8:30 AM on 1/27.  Rush Farmer, M.D. [facility] Pulmonary/Critical Care Medicine. Pager: [phone]. After hours pager: [phone].  03/29/2014, 11:08 AM

## 2014-03-29 NOTE — ED Notes (Signed)
Pt taken to Ct.

## 2014-03-29 NOTE — Progress Notes (Signed)
[facility] PROGRESS NOTE   Nyomie Ehrlich ZOX:[id number] DOB: 27-Feb-1946 DOA: 03/28/2014 PCP: Shirline Frees, MD  HPI: Lori Park is a 69 y.o. female with past medical history of hypertension and dyslipidemia, motor disorder. The patient presented with complaints of dizziness and lightheadedness. She mentions that since last one month she has had a sense of "not feeling well". A month ago she was seen in urgent care at which time she mention this to her urgent care provider. Since last 5 days she has been having complaints of dizziness. She initially noted the symptoms when she was trying to get out of the bed and was having difficulty maintaining balance. Over the weekend she felt better but then yesterday again she started having complaints of dizziness and lightheadedness. She was having difficulty remembering short-term events. There was no fall no trauma no staring episode no loss of consciousness no loss of bowel or bladder control no chest pain no shortness of breath no fever no chills no cough no congestion no nausea no vomiting no diarrhea no burning urination. There is no recent change in her medication. Patient was unable to verify whether she is taking Zoloft or Celexa.   The patient is coming from home. And at her baseline independent for most of her ADL.  Subjective/24 hour interval: Patient told me that she went to urgent care yesterday and her provider there did some neuro tests and suggested she come straight to [facility] ED with concerns for stroke. CT/MRI ruled out stroke but discovered several brain metastases. CT chest and abdomen were performed to search for primary lesion. The patient did not sleep well last night, but she is pleasant and cooperative. She feels her symptoms might be a little improved (perhaps due to Decadron). We shared with them the results of CT chest/abdomen revealing a large mass in the right lung. Mrs. Menter and her family received the news  calmly but do seem hit by this news.  Assessment/Plan: Principal Problem:   Brain metastases Active Problems:   Dizziness   Vasogenic cerebral edema   Family history of colon cancer   Brain Metastases -Patient's initial complaint of dizziness, trouble finding words, and some memory problems -CT head w/o contrast: "Multiple expansile hypodensities in the supra and infratentorial brain, largest in LEFT frontal lobe inferring underlying masses, concerning for metastatic disease (less likely infection)." -MRI brain:  "At least 4 infratentorial and greater than 14 supratentorial cystic metastasis. Surrounding vasogenic edema, with pontine edema resulting in mild pre pontine cistern effacement. No midline shift.  Remote small RIGHT cerebellar infarcts." -Performed CT chest showed 4.8 cm mass, likely this is the primary cancer. -Continue Decadron 4 mg IV four times a day to reduce inflammation/swelling.  Right Lung Mass -Likely the primary lesion that led to brain mets. -Patient has 30 pack-year smoking history; quit 9 years ago. -CT chest:  4.8 x 4.5 cm mass in the superior segment of the right lower lung with air bronchograms. Prominent right hilar lymph node measuring 12 mm is probably metastatic. This likely represents primary lung cancer. Dominant metastasis not entirely excluded. There are vague nodular ground-glass lesions demonstrated both lungs, measuring less than 1 cm diameter. These are likely metastatic. -CT abdomen with no relevant findings. -Continue telemetry and neuro checks. -Consult oncology, interventional radiology, and pulmonary CCM. -Bronchoscopy scheduled for tomorrow. -Continue regular diet; restart NPO at midnight.  Hypertension - Patient is on lisinopril at home, held on admission, restart lisinopril.  Hyperlipidemia -Discontinue home Pravachol (Pravastatin). -Start Simvastatin.  Code Status: Full Code Family Communication: Plan discussed with the patient.  Husband Mikki Santee and niece Nira Conn at bedside. Disposition Plan: Remains inpatient Diet: Diet regular  Consultants:  Oncology  Interventional radiology  Pulmonary CCM  Procedures:  Bronchoscopy 03/30/2014.  Antibiotics:  None.    Objective: Filed Vitals:   03/29/14 0851  BP: 158/94  Pulse: 93  Temp: 97.4 F (36.3 C)  Resp: 18    Intake/Output Summary (Last 24 hours) at 03/29/14 1300 Last data filed at 03/29/14 0851  Gross per 24 hour  Intake      0 ml  Output      0 ml  Net      0 ml   Filed Weights   03/29/14 0228  Weight: 77.565 kg (171 lb)    Exam: General: Alert and awake, oriented x3, not in any acute distress but appeared worried. HEENT: anicteric sclera, pupils reactive to light and accommodation. No eye nasal or ear discharge. MMM. CVS: S1-S2 clear, no murmur rubs or gallops. Radial and pedal pulses 3+. Chest: clear to auscultation bilaterally, no wheezing, rales or rhonchi Abdomen: soft nontender, nondistended, absent to quiet bowel sounds.  Extremities: no cyanosis, clubbing or edema noted bilaterally. Left upper and lower extremity 5/5 strength. Right upper and lower extremity strength decreased. Skin: warm and dry Neuro: Pedal light sensation intact.  Psych: appropriate mood and affect.   Data Reviewed: Basic Metabolic Panel:  Recent Labs Lab 03/28/14 1900  NA 137  K 3.6  CL 99  CO2 27  GLUCOSE 119*  BUN 9  CREATININE 0.80  CALCIUM 10.1   Liver Function Tests:  Recent Labs Lab 03/28/14 1900  AST 33  ALT 29  ALKPHOS 49  BILITOT 1.0  PROT 7.7  ALBUMIN 4.7   No results for input(s): LIPASE, AMYLASE in the last 168 hours. No results for input(s): AMMONIA in the last 168 hours. CBC:  Recent Labs Lab 03/28/14 1900  WBC 6.3  NEUTROABS 3.3  HGB 13.5  HCT 40.5  MCV 103.6*  PLT 349   Cardiac Enzymes: No results for input(s): CKTOTAL, CKMB, CKMBINDEX, TROPONINI in the last 168 hours. BNP (last 3 results) No results for  input(s): PROBNP in the last 8760 hours. CBG: No results for input(s): GLUCAP in the last 168 hours.  Micro No results found for this or any previous visit (from the past 240 hour(s)).   Studies: Ct Head Wo Contrast  03/28/2014   CLINICAL DATA:  Dizziness for 4-5 days, forgetfulness. History of hypertension and hyperlipidemia.  EXAM: CT HEAD WITHOUT CONTRAST  TECHNIQUE: Contiguous axial images were obtained from the base of the skull through the vertex without intravenous contrast.  COMPARISON:  None.  FINDINGS: Expansile vasogenic edema in LEFT upper lobe with mild mass effect on the LEFT lateral ventricle, no hydrocephalus. In addition, LEFT posterior frontal lobe encephalomalacia. Additional expansile hypodensities in the pons, RIGHT brachium pontis. Mild expansile hypodensity in RIGHT parietal lobe. No intraparenchymal hemorrhage. No acute large vascular territory infarct.  No abnormal extra-axial fluid collections. Basal cisterns are patent. Mild calcific atherosclerosis of the carotid siphons.  No skull fracture. The included ocular globes and orbital contents are non-suspicious. The mastoid aircells and included paranasal sinuses are well-aerated.  IMPRESSION: Multiple expansile hypodensities in the supra and infratentorial brain, largest in LEFT frontal lobe inferring underlying masses, concerning for metastatic disease (less likely infection) and would be better characterized on MRI of the brain with contrast.  Acute findings discussed with and reconfirmed by  Dr.MELANIE BELFI on 03/28/2014 at 9:40 pm.   Electronically Signed   By: Elon Alas   On: 03/28/2014 21:41   Ct Chest W Contrast  03/29/2014   CLINICAL DATA:  Brain metastases.  Evaluate for primary source.  EXAM: CT CHEST, ABDOMEN, AND PELVIS WITH CONTRAST  TECHNIQUE: Multidetector CT imaging of the chest, abdomen and pelvis was performed following the standard protocol during bolus administration of intravenous contrast.  CONTRAST:   174mL OMNIPAQUE IOHEXOL 300 MG/ML  SOLN  COMPARISON:  CT chest 09/08/2007  FINDINGS: CT CHEST FINDINGS  Normal heart size. Normal caliber thoracic aorta. Great vessel origins are patent. Calcification of the aorta and Coronary arteries. Esophagus is decompressed.  Mass in the superior segment of the right lower lung with air bronchograms. The mass lesion measures about 4.8 x 4.5 cm. Prominent right hilar lymph node measuring 12 mm is probably metastatic. This likely represents primary lung cancer. Dominant metastasis not entirely excluded. There are vague nodular ground-glass lesions demonstrated both lungs, measuring less than 1 cm diameter. These are likely metastatic. Focal areas scarring in the right middle lung is unchanged since prior study. No pneumothorax. No pleural effusions. Airways appear patent.  CT ABDOMEN AND PELVIS FINDINGS  Diffuse fatty infiltration of the liver. No focal liver lesions appreciated. The gallbladder, pancreas, spleen, adrenal glands, kidneys, inferior vena cava, and retroperitoneal lymph nodes are unremarkable. Calcification of the abdominal aorta without aneurysm. Small accessory spleen. Stomach, small bowel, and colon appear normal for degree of distention. Contrast material flows through to the colon without evidence of bowel obstruction. No free air or free fluid in the abdomen.  Pelvis: Diverticulosis of the sigmoid colon without evidence of diverticulitis. Bladder wall is not thickened. Uterus and ovaries are not enlarged. Appendix is normal. No free or loculated pelvic fluid collections. No pelvic mass or lymphadenopathy. Bones: Degenerative changes in the thoracic and lumbar spine with normal alignment. No destructive bone lesions are identified.  IMPRESSION: 4.8 cm mass in the superior segment right lower lung likely represents primary neoplasm. Additional small nodular ground-glass lesions in both lungs may represent metastasis. Probable right hilar lymph node metastasis.   Diffuse fatty infiltration of the liver. No evidence of metastatic disease in the abdomen or pelvis.   Electronically Signed   By: Lucienne Capers M.D.   On: 03/29/2014 02:04   Mr Jeri Cos EH Contrast  03/28/2014   CLINICAL DATA:  Four-day history of dizziness, lightheadedness, speech difficulties. Gait instability.  EXAM: MRI HEAD WITHOUT AND WITH CONTRAST  TECHNIQUE: Multiplanar, multiecho pulse sequences of the brain and surrounding structures were obtained without and with intravenous contrast.  CONTRAST:  109mL MULTIHANCE GADOBENATE DIMEGLUMINE 529 MG/ML IV SOLN  COMPARISON:  CT of the head March 28, 2014 at 2116 hr  FINDINGS: No reduced diffusion to suggest acute ischemia, hypercellular large tumor or purulent material. No susceptibility artifact to suggest hemorrhage.  At least 4 infratentorially greater than 14 supratentorial intermediate FLAIR signal, T2 hyperintense peripherally enhancing cystic metastasis seen at the gray-white matter junction. Surrounding T2 bright vasogenic edema, most apparent within the LEFT frontal lobe with sulcal effacement, no midline shift. Mild T2 bright vasogenic edema within the pons partially effaces the pre pontine cistern, with mild mass effect on the basilar artery. Mild white matter changes of exclusive of the aforementioned bowel abnormality most consistent chronic small vessel ischemic disease.  Small remote RIGHT cerebellar infarcts. No abnormal extra-axial fluid collections. No suspicious dural, leptomeningeal enhancement. No extra-axial masses. Major intracranial  vascular flow voids are preserved at the skullbase.  No paranasal sinus air-fluid levels. Trace fluid signal in the RIGHT mastoid air cells.  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 Surrounding vasogenic edema, with pontine edema resulting in mild pre pontine cistern effacement. No midline shift.  Remote small RIGHT cerebellar infarcts.   Electronically  Signed   By: Elon Alas   On: 03/28/2014 23:42   Ct Abdomen Pelvis W Contrast  03/29/2014   CLINICAL DATA:  Brain metastases.  Evaluate for primary source.  EXAM: CT CHEST, ABDOMEN, AND PELVIS WITH CONTRAST  TECHNIQUE: Multidetector CT imaging of the chest, abdomen and pelvis was performed following the standard protocol during bolus administration of intravenous contrast.  CONTRAST:  169mL OMNIPAQUE IOHEXOL 300 MG/ML  SOLN  COMPARISON:  CT chest 09/08/2007  FINDINGS: CT CHEST FINDINGS  Normal heart size. Normal caliber thoracic aorta. Great vessel origins are patent. Calcification of the aorta and Coronary arteries. Esophagus is decompressed.  Mass in the superior segment of the right lower lung with air bronchograms. The mass lesion measures about 4.8 x 4.5 cm. Prominent right hilar lymph node measuring 12 mm is probably metastatic. This likely represents primary lung cancer. Dominant metastasis not entirely excluded. There are vague nodular ground-glass lesions demonstrated both lungs, measuring less than 1 cm diameter. These are likely metastatic. Focal areas scarring in the right middle lung is unchanged since prior study. No pneumothorax. No pleural effusions. Airways appear patent.  CT ABDOMEN AND PELVIS FINDINGS  Diffuse fatty infiltration of the liver. No focal liver lesions appreciated. The gallbladder, pancreas, spleen, adrenal glands, kidneys, inferior vena cava, and retroperitoneal lymph nodes are unremarkable. Calcification of the abdominal aorta without aneurysm. Small accessory spleen. Stomach, small bowel, and colon appear normal for degree of distention. Contrast material flows through to the colon without evidence of bowel obstruction. No free air or free fluid in the abdomen.  Pelvis: Diverticulosis of the sigmoid colon without evidence of diverticulitis. Bladder wall is not thickened. Uterus and ovaries are not enlarged. Appendix is normal. No free or loculated pelvic fluid  collections. No pelvic mass or lymphadenopathy. Bones: Degenerative changes in the thoracic and lumbar spine with normal alignment. No destructive bone lesions are identified.  IMPRESSION: 4.8 cm mass in the superior segment right lower lung likely represents primary neoplasm. Additional small nodular ground-glass lesions in both lungs may represent metastasis. Probable right hilar lymph node metastasis.  Diffuse fatty infiltration of the liver. No evidence of metastatic disease in the abdomen or pelvis.   Electronically Signed   By: Lucienne Capers M.D.   On: 03/29/2014 02:04    Scheduled Meds: . dexamethasone  4 mg Intravenous 4 times per day  . sertraline  25 mg Oral Daily  . simvastatin  20 mg Oral Daily   Continuous Infusions: . sodium chloride 50 mL/hr at 03/29/14 0516       Time spent: 35 minutes    Magda Bernheim, PA-S  [facility] Pager [phone] If 7PM-7AM, please contact night-coverage at [email or web address], password [facility] 03/29/2014, 1:00 PM  LOS: 1 day      Addendum  Patient seen and examined, chart and data base reviewed.  I agree with the above assessment and plan.  For full details please see Mrs. Magda Bernheim, PA-S note.  I reviewed and amended the above note as appropriate.  Presented with multiple brain metastases, right lung mass consistent with primary bronchogenic carcinoma.  Bronchoscopy in a.m.  Birdie Hopes, MD [facility] Pager: [phone] 03/29/2014, 4:41 PM

## 2014-03-29 NOTE — Evaluation (Signed)
Occupational Therapy Evaluation Patient Details Name: Lori Park MRN: [id number] DOB: [birth year] Today's Date: 03/29/2014    History of Present Illness Patient presents with a four-day history of dizziness. She feels a little bit lightheaded. There is no vertiginous-type symptoms. She's had trouble getting her words out. She states that she feels like she knows what she wants to say but has trouble forming her words. She states her balance has been off and she's been stumbling. She feels like her coordination is off. 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   Clinical Impression   Patient was independent PTA. Patient currently functioning at an overall min assist>supervision level. Patient will benefit from acute OT to increase overall independence in the areas of ADLs, functional mobility, safety in order to safely discharge home.     Follow Up Recommendations  No OT follow up;Supervision/Assistance - 24 hour    Equipment Recommendations  None recommended by OT    Recommendations for Other Services  None at this time     Precautions / Restrictions Precautions Precautions: Fall Restrictions Weight Bearing Restrictions: No      Mobility - Per PT evaluation Bed Mobility Overal bed mobility: Modified Independent  Transfers Overall transfer level: Needs assistance   Transfers: Sit to/from Stand Sit to Stand: Min assist         General transfer comment: upon standing pt with posterior lean with slight lean to right and relying on support from bed on back of legs.    Balance - Per PT evaluation  Overall balance assessment: Needs assistance Sitting-balance support: Feet supported Sitting balance-Leahy Scale: Fair   Postural control: Posterior lean;Right lateral lean Standing balance support: No upper extremity supported Standing balance-Leahy Scale: Poor Standing balance comment: slight lean posteriorly and to  the R.  Worked on proprioceptive training on midline stance.     ADL Overall ADL's : Needs assistance/impaired     Grooming: Wash/dry hands;Wash/dry face;Oral care;Standing;Min guard Grooming Details (indicate cue type and reason): min guard secondary to posterior lean Upper Body Bathing: Supervision/ safety;Sitting   Lower Body Bathing: Min guard;Sit to/from stand;Cueing for sequencing   Upper Body Dressing : Supervision/safety;Sitting   Lower Body Dressing: Supervision/safety;Sit to/from stand;Cueing for sequencing   Toilet Transfer: Min guard;Ambulation   Functional mobility during ADLs: Min guard General ADL Comments: Patient able to cross BLEs for LB ADLs. Patient with slow processing during OT eval Patient overall min guard > supervision secondary to posterior lean during standing. Patient's niece present during eval and seemed very supportive.      Vision Eye Alignment: Within Functional Limits Alignment/Gaze Preference: Within Defined Limits Ocular Range of Motion: Within Functional Limits Tracking/Visual Pursuits: Decreased smoothness of eye movement to RIGHT superior field;Decreased smoothness of eye movement to RIGHT inferior field Saccades: Within functional limits Convergence: Impaired - to be further tested in functional context   Perception Perception Perception Tested?: No   Praxis Praxis Praxis tested?: Within functional limits    Pertinent Vitals/Pain Pain Assessment: No/denies pain     Hand Dominance Left   Extremity/Trunk Assessment Upper Extremity Assessment Upper Extremity Assessment: Overall WFL for tasks assessed   Lower Extremity Assessment Lower Extremity Assessment: Overall WFL for tasks assessed (heel to shin intact and sensation intact to light touch)   Cervical / Trunk Assessment Cervical / Trunk Assessment: Normal   Communication Communication Communication: No difficulties   Cognition Arousal/Alertness: Awake/alert Behavior  During Therapy: WFL for tasks assessed/performed Overall Cognitive  Status: Impaired/Different from baseline Area of Impairment: Problem solving;Following commands       Following Commands: Follows multi-step commands with increased time     Problem Solving: Slow processing;Decreased initiation;Requires verbal cues General Comments: Pt very pleasant and taking increased time to answer questions and to make decisions.              Home Living Family/patient expects to be discharged to:: Private residence Living Arrangements: Spouse/significant other   Type of Home: House       Home Layout: Two level Alternate Level Stairs-Number of Steps: 1 or 2 to porch   Bathroom Shower/Tub: Walk-in shower;Door   [employer] Toilet: Standard     Home Equipment: None          Prior Functioning/Environment Level of Independence: Independent  Comments: retired    OT Diagnosis: Generalized weakness;Cognitive deficits;Acute pain   OT Problem List: Decreased strength;Decreased activity tolerance;Impaired balance (sitting and/or standing);Impaired vision/perception;Decreased coordination;Decreased cognition;Decreased safety awareness;Decreased knowledge of use of DME or AE;Decreased knowledge of precautions;Pain   OT Treatment/Interventions: Self-care/ADL training;Therapeutic exercise;DME and/or AE instruction;Energy conservation;Therapeutic activities;Patient/family education;Balance training    OT Goals(Current goals can be found in the care plan section) Acute Rehab OT Goals Patient Stated Goal: To get better balance OT Goal Formulation: With patient/family Time For Goal Achievement: 04/05/14 Potential to Achieve Goals: Good ADL Goals Pt Will Perform Grooming: standing;with modified independence Pt Will Transfer to Toilet: with modified independence;ambulating Pt Will Perform Tub/Shower Transfer: with modified independence;shower seat;ambulating Pt/caregiver will Perform Home  Exercise Program: Both right and left upper extremity;With written HEP provided;With Supervision;Increased strength (using equipment prn)  OT Frequency: Min 2X/week   Barriers to D/C: None known at this time          End of Session Equipment Utilized During Treatment: Gait belt  Activity Tolerance: Patient tolerated treatment well Patient left: in chair;with call bell/phone within reach;with family/visitor present   Time: 7530-0511 OT Time Calculation (min): 21 min Charges:  OT Evaluation $Initial OT Evaluation Tier I: 1 Procedure  Lanora Reveron , MS, OTR/L, CLT Pager: [phone]  03/29/2014, 11:29 AM

## 2014-03-30 ENCOUNTER — Inpatient Hospital Stay (HOSPITAL_COMMUNITY): Payer: PPO

## 2014-03-30 ENCOUNTER — Other Ambulatory Visit: Payer: Self-pay | Admitting: Pulmonary Disease

## 2014-03-30 ENCOUNTER — Encounter (HOSPITAL_COMMUNITY)
Admission: EM | Disposition: A | Discharge status: Home Health | Payer: PPO | Source: Home / Self Care | Attending: Internal Medicine

## 2014-03-30 ENCOUNTER — Telehealth: Payer: Self-pay

## 2014-03-30 DIAGNOSIS — R918 Other nonspecific abnormal finding of lung field: Secondary | ICD-10-CM

## 2014-03-30 DIAGNOSIS — C7931 Secondary malignant neoplasm of brain: Secondary | ICD-10-CM | POA: Insufficient documentation

## 2014-03-30 DIAGNOSIS — C801 Malignant (primary) neoplasm, unspecified: Secondary | ICD-10-CM

## 2014-03-30 HISTORY — PX: VIDEO BRONCHOSCOPY: SHX5072

## 2014-03-30 LAB — COMPREHENSIVE METABOLIC PANEL
ALK PHOS: 37 U/L — AB (ref 39–117)
ALT: 23 U/L (ref 0–35)
AST: 27 U/L (ref 0–37)
Albumin: 4 g/dL (ref 3.5–5.2)
Anion gap: 13 (ref 5–15)
BUN: 11 mg/dL (ref 6–23)
CALCIUM: 9.3 mg/dL (ref 8.4–10.5)
CO2: 20 mmol/L (ref 19–32)
Chloride: 105 mmol/L (ref 96–112)
Creatinine, Ser: 0.83 mg/dL (ref 0.50–1.10)
GFR calc Af Amer: 82 mL/min — ABNORMAL LOW (ref >90)
GFR calc non Af Amer: 70 mL/min — ABNORMAL LOW (ref >90)
Glucose, Bld: 152 mg/dL — ABNORMAL HIGH (ref 70–99)
Potassium: 4.5 mmol/L (ref 3.5–5.1)
Sodium: 138 mmol/L (ref 135–145)
Total Bilirubin: 1.1 mg/dL (ref 0.3–1.2)
Total Protein: 6.8 g/dL (ref 6.0–8.3)

## 2014-03-30 LAB — CBC WITH DIFFERENTIAL/PLATELET
BASOS ABS: 0 10*3/uL (ref 0.0–0.1)
BASOS PCT: 0 % (ref 0–1)
Eosinophils Absolute: 0 10*3/uL (ref 0.0–0.7)
Eosinophils Relative: 0 % (ref 0–5)
HCT: 39 % (ref 36.0–46.0)
Hemoglobin: 13 g/dL (ref 12.0–15.0)
Lymphocytes Relative: 16 % (ref 12–46)
Lymphs Abs: 1.2 10*3/uL (ref 0.7–4.0)
MCH: 33.9 pg (ref 26.0–34.0)
MCHC: 33.3 g/dL (ref 30.0–36.0)
MCV: 101.6 fL — AB (ref 78.0–100.0)
Monocytes Absolute: 0.3 10*3/uL (ref 0.1–1.0)
Monocytes Relative: 4 % (ref 3–12)
Neutro Abs: 5.8 10*3/uL (ref 1.7–7.7)
Neutrophils Relative %: 80 % — ABNORMAL HIGH (ref 43–77)
PLATELETS: 359 10*3/uL (ref 150–400)
RBC: 3.84 MIL/uL — AB (ref 3.87–5.11)
RDW: 12.9 % (ref 11.5–15.5)
WBC: 7.3 10*3/uL (ref 4.0–10.5)

## 2014-03-30 LAB — PROTIME-INR
INR: 1.01 (ref 0.00–1.49)
PROTHROMBIN TIME: 13.4 s (ref 11.6–15.2)

## 2014-03-30 SURGERY — BRONCHOSCOPY, WITH FLUOROSCOPY
Anesthesia: Moderate Sedation | Laterality: Bilateral

## 2014-03-30 MED ORDER — LIDOCAINE HCL 2 % EX GEL
CUTANEOUS | Status: DC | PRN
  Administered 2014-03-30: 1

## 2014-03-30 MED ORDER — MIDAZOLAM HCL 5 MG/ML IJ SOLN
INTRAMUSCULAR | Status: AC
Start: 2014-03-30 — End: 2014-03-30
  Filled 2014-03-30: qty 2

## 2014-03-30 MED ORDER — FENTANYL CITRATE 0.05 MG/ML IJ SOLN
INTRAMUSCULAR | Status: AC
  Filled 2014-03-30: qty 4

## 2014-03-30 MED ORDER — MIDAZOLAM HCL 5 MG/ML IJ SOLN
INTRAMUSCULAR | Status: DC | PRN
  Administered 2014-03-30: 3 mg via INTRAVENOUS
  Administered 2014-03-30: 1 mg via INTRAVENOUS

## 2014-03-30 MED ORDER — PHENYLEPHRINE HCL 0.25 % NA SOLN
NASAL | Status: DC | PRN
  Administered 2014-03-30: 2 via NASAL

## 2014-03-30 MED ORDER — LIDOCAINE HCL (PF) 1 % IJ SOLN
INTRAMUSCULAR | Status: DC | PRN
Start: 2014-03-30 — End: 2014-03-30
  Administered 2014-03-30: 6 mL

## 2014-03-30 MED ORDER — SODIUM CHLORIDE 0.9 % IV SOLN
INTRAVENOUS | Status: DC
  Administered 2014-03-30: 08:00:00 via INTRAVENOUS

## 2014-03-30 MED ORDER — FLUMAZENIL 0.5 MG/5ML IV SOLN
INTRAVENOUS | Status: DC | PRN
  Administered 2014-03-30: 0.2 mg via INTRAVENOUS

## 2014-03-30 MED ORDER — FENTANYL CITRATE 0.05 MG/ML IJ SOLN
INTRAMUSCULAR | Status: DC | PRN
  Administered 2014-03-30 (×2): 50 ug via INTRAVENOUS

## 2014-03-30 NOTE — Progress Notes (Signed)
Physical Therapy Treatment Patient Details Name: Lori Park MRN: [id number] DOB: [birth year] Today's Date: 03/30/2014    History of Present Illness Patient presents with a four-day history of dizziness. She feels a little bit lightheaded. There is no vertiginous-type symptoms. She's had trouble getting her words out. She states that she feels like she knows what she wants to say but has trouble forming her words. She states her balance has been off and she's been stumbling. She feels like her coordination is off. 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    PT Comments    Pt. Has balance deficits in standing with a narrow BOS and will benefit from more static and dynamic balance training before being d/c'd to home to decrease the risk of falls.  Pt. Should walk further next session as well.    Follow Up Recommendations  Home health PT     Equipment Recommendations  None recommended by PT    Recommendations for Other Services       Precautions / Restrictions Precautions Precautions: Fall Restrictions Weight Bearing Restrictions: No    Mobility  Bed Mobility Overal bed mobility: Independent                Transfers Overall transfer level: Needs assistance Equipment used: None Transfers: Sit to/from Stand Sit to Stand: Min guard         General transfer comment: min g for safety, pt. did show a posterior lean upon standing and aware of it. vc and tactile cues to hold in stomack muscles.  Ambulation/Gait Ambulation/Gait assistance: Supervision Ambulation Distance (Feet): 80 Feet Assistive device: None Gait Pattern/deviations: Step-through pattern;Drifts right/left;Leaning posteriorly   Gait velocity interpretation: at or above normal speed for age/gender General Gait Details: She ambulated without rail with a slight drift to right   Stairs            Wheelchair Mobility    Modified Rankin  (Stroke Patients Only)       Balance           Standing balance support: No upper extremity supported Standing balance-Leahy Scale: Poor Standing balance comment: able to maintain midline with perturbations with a wide BOS, but when tried narrow BOS (i.e. SLS and tandemstance) LOB immediately                    Cognition Arousal/Alertness: Awake/alert Behavior During Therapy: WFL for tasks assessed/performed Overall Cognitive Status: Within Functional Limits for tasks assessed Area of Impairment: Safety/judgement;Awareness;Attention   Current Attention Level: Sustained   Following Commands: Follows multi-step commands with increased time Safety/Judgement: Decreased awareness of safety;Decreased awareness of deficits Awareness: Emergent Problem Solving: Slow processing;Decreased initiation;Requires verbal cues General Comments: Discussed patient's cognitive status with family and recommendation of 24/7 supervision/assitance once discharged > home    Exercises      General Comments General comments (skin integrity, edema, etc.): visitor (niece?) was present and supportive and informed me how she did with OT      Pertinent Vitals/Pain Pain Assessment: No/denies pain    Home Living                      Prior Function            PT Goals (current goals can now be found in the care plan section) Progress towards PT goals: Progressing toward goals    Frequency  Min 4X/week    PT Plan Current  plan remains appropriate    Co-evaluation             End of Session Equipment Utilized During Treatment: Gait belt Activity Tolerance: Patient tolerated treatment well Patient left: in bed;with family/visitor present;with call bell/phone within reach;with bed alarm set     Time: 1455-1515 PT Time Calculation (min) (ACUTE ONLY): 20 min  Charges:  $Gait Training: 8-22 mins                    G Codes:      Jodi Geralds, SPTA 03/30/2014, 3:32  PM

## 2014-03-30 NOTE — Progress Notes (Signed)
Name: Lori Park MRN: [id number] DOB: 11/29/1945    ADMISSION DATE:  03/28/2014 CONSULTATION DATE:  03/29/2014  REFERRING MD :  TRH  CHIEF COMPLAINT:  Lung mass  BRIEF PATIENT DESCRIPTION: 69 year old female with history of HTN and dyslipidemia presents to the hospital with dizziness and light headedness.  Has not been feeling well x1 month now has been dizzy for 5 days.  Neurologically also had memory deficit but no fever, chills, N/V, hemoptysis or sputum production.  CT of the chest was done that showed multiple lung masses with one large one.  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  CT guided biopsy recommended by oncology but IR recommended evaluation by PCCM prior to procedure due to high risk of bleeding.  SIGNIFICANT EVENTS  1/26 PCCM consulted for ?bronchoscopy. 1/27 Bronch with fluoro and biopsy.  SUBJECTIVE: No events overnight, anxious but stable.  VITAL SIGNS: Temp:  [97.8 F (36.6 C)-98.4 F (36.9 C)] 98.1 F (36.7 C) (01/27 0205) Pulse Rate:  [80-96] 84 (01/27 0820) Resp:  [18-25] 19 (01/27 0820) BP: (115-179)/(68-103) 158/80 mmHg (01/27 0820) SpO2:  [93 %-100 %] 98 % (01/27 0820)  PHYSICAL EXAMINATION: General:  Well appearing female, NAD. Neuro:  Alert and interactive, mild tremor. HEENT:  [place]/AT, PERRL, EOM-I and MMM. Cardiovascular:  RRR, Nl S1/S2, -M/R/G. Lungs:  CTA bilaterally. Abdomen:  Soft, NT, ND and +BS. Musculoskeletal:  -edema and -tenderness. Skin:  Intact.  Recent Labs Lab 03/28/14 1900 03/30/14 0447  NA 137 138  K 3.6 4.5  CL 99 105  CO2 27 20  BUN 9 11  CREATININE 0.80 0.83  GLUCOSE 119* 152*   Recent Labs Lab 03/28/14 1900 03/30/14 0447  HGB 13.5 13.0  HCT 40.5 39.0  WBC 6.3 7.3  PLT 349 359   Ct Head Wo Contrast  03/28/2014   CLINICAL DATA:  Dizziness for 4-5 days, forgetfulness. History of hypertension and hyperlipidemia.  EXAM: CT HEAD WITHOUT  CONTRAST  TECHNIQUE: Contiguous axial images were obtained from the base of the skull through the vertex without intravenous contrast.  COMPARISON:  None.  FINDINGS: Expansile vasogenic edema in LEFT upper lobe with mild mass effect on the LEFT lateral ventricle, no hydrocephalus. In addition, LEFT posterior frontal lobe encephalomalacia. Additional expansile hypodensities in the pons, RIGHT brachium pontis. Mild expansile hypodensity in RIGHT parietal lobe. No intraparenchymal hemorrhage. No acute large vascular territory infarct.  No abnormal extra-axial fluid collections. Basal cisterns are patent. Mild calcific atherosclerosis of the carotid siphons.  No skull fracture. The included ocular globes and orbital contents are non-suspicious. The mastoid aircells and included paranasal sinuses are well-aerated.  IMPRESSION: Multiple expansile hypodensities in the supra and infratentorial brain, largest in LEFT frontal lobe inferring underlying masses, concerning for metastatic disease (less likely infection) and would be better characterized on MRI of the brain with contrast.  Acute findings discussed with and reconfirmed by Dr.MELANIE BELFI on 03/28/2014 at 9:40 pm.   Electronically Signed   By: Elon Alas   On: 03/28/2014 21:41   Ct Chest W Contrast  03/29/2014   CLINICAL DATA:  Brain metastases.  Evaluate for primary source.  EXAM: CT CHEST, ABDOMEN, AND PELVIS WITH CONTRAST  TECHNIQUE: Multidetector CT imaging of the chest, abdomen and pelvis was performed following the standard protocol during bolus administration of intravenous contrast.  CONTRAST:  138mL OMNIPAQUE IOHEXOL 300 MG/ML  SOLN  COMPARISON:  CT chest 09/08/2007  FINDINGS: CT CHEST FINDINGS  Normal heart size. Normal caliber thoracic aorta. Great vessel origins are patent. Calcification of the aorta and Coronary arteries. Esophagus is decompressed.  Mass in the superior segment of the right lower lung with air bronchograms. The mass lesion  measures about 4.8 x 4.5 cm. Prominent right hilar lymph node measuring 12 mm is probably metastatic. This likely represents primary lung cancer. Dominant metastasis not entirely excluded. There are vague nodular ground-glass lesions demonstrated both lungs, measuring less than 1 cm diameter. These are likely metastatic. Focal areas scarring in the right middle lung is unchanged since prior study. No pneumothorax. No pleural effusions. Airways appear patent.  CT ABDOMEN AND PELVIS FINDINGS  Diffuse fatty infiltration of the liver. No focal liver lesions appreciated. The gallbladder, pancreas, spleen, adrenal glands, kidneys, inferior vena cava, and retroperitoneal lymph nodes are unremarkable. Calcification of the abdominal aorta without aneurysm. Small accessory spleen. Stomach, small bowel, and colon appear normal for degree of distention. Contrast material flows through to the colon without evidence of bowel obstruction. No free air or free fluid in the abdomen.  Pelvis: Diverticulosis of the sigmoid colon without evidence of diverticulitis. Bladder wall is not thickened. Uterus and ovaries are not enlarged. Appendix is normal. No free or loculated pelvic fluid collections. No pelvic mass or lymphadenopathy. Bones: Degenerative changes in the thoracic and lumbar spine with normal alignment. No destructive bone lesions are identified.  IMPRESSION: 4.8 cm mass in the superior segment right lower lung likely represents primary neoplasm. Additional small nodular ground-glass lesions in both lungs may represent metastasis. Probable right hilar lymph node metastasis.  Diffuse fatty infiltration of the liver. No evidence of metastatic disease in the abdomen or pelvis.   Electronically Signed   By: Lucienne Capers M.D.   On: 03/29/2014 02:04   Mr Jeri Cos VF Contrast  03/28/2014   CLINICAL DATA:  Four-day history of dizziness, lightheadedness, speech difficulties. Gait instability.  EXAM: MRI HEAD WITHOUT AND WITH  CONTRAST  TECHNIQUE: Multiplanar, multiecho pulse sequences of the brain and surrounding structures were obtained without and with intravenous contrast.  CONTRAST:  72mL MULTIHANCE GADOBENATE DIMEGLUMINE 529 MG/ML IV SOLN  COMPARISON:  CT of the head March 28, 2014 at 2116 hr  FINDINGS: No reduced diffusion to suggest acute ischemia, hypercellular large tumor or purulent material. No susceptibility artifact to suggest hemorrhage.  At least 4 infratentorially greater than 14 supratentorial intermediate FLAIR signal, T2 hyperintense peripherally enhancing cystic metastasis seen at the gray-white matter junction. Surrounding T2 bright vasogenic edema, most apparent within the LEFT frontal lobe with sulcal effacement, no midline shift. Mild T2 bright vasogenic edema within the pons partially effaces the pre pontine cistern, with mild mass effect on the basilar artery. Mild white matter changes of exclusive of the aforementioned bowel abnormality most consistent chronic small vessel ischemic disease.  Small remote RIGHT cerebellar infarcts. No abnormal extra-axial fluid collections. No suspicious dural, leptomeningeal enhancement. No extra-axial masses. Major intracranial vascular flow voids are preserved at the skullbase.  No paranasal sinus air-fluid levels. Trace fluid signal in the RIGHT mastoid air cells.  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 Surrounding vasogenic edema, with pontine edema resulting in mild pre pontine cistern effacement. No midline shift.  Remote small RIGHT cerebellar infarcts.   Electronically Signed   By: Elon Alas   On: 03/28/2014 23:42   Ct Abdomen Pelvis W Contrast  03/29/2014   CLINICAL DATA:  Brain metastases.  Evaluate for primary source.  EXAM:  CT CHEST, ABDOMEN, AND PELVIS WITH CONTRAST  TECHNIQUE: Multidetector CT imaging of the chest, abdomen and pelvis was performed following the standard protocol during bolus  administration of intravenous contrast.  CONTRAST:  138mL OMNIPAQUE IOHEXOL 300 MG/ML  SOLN  COMPARISON:  CT chest 09/08/2007  FINDINGS: CT CHEST FINDINGS  Normal heart size. Normal caliber thoracic aorta. Great vessel origins are patent. Calcification of the aorta and Coronary arteries. Esophagus is decompressed.  Mass in the superior segment of the right lower lung with air bronchograms. The mass lesion measures about 4.8 x 4.5 cm. Prominent right hilar lymph node measuring 12 mm is probably metastatic. This likely represents primary lung cancer. Dominant metastasis not entirely excluded. There are vague nodular ground-glass lesions demonstrated both lungs, measuring less than 1 cm diameter. These are likely metastatic. Focal areas scarring in the right middle lung is unchanged since prior study. No pneumothorax. No pleural effusions. Airways appear patent.  CT ABDOMEN AND PELVIS FINDINGS  Diffuse fatty infiltration of the liver. No focal liver lesions appreciated. The gallbladder, pancreas, spleen, adrenal glands, kidneys, inferior vena cava, and retroperitoneal lymph nodes are unremarkable. Calcification of the abdominal aorta without aneurysm. Small accessory spleen. Stomach, small bowel, and colon appear normal for degree of distention. Contrast material flows through to the colon without evidence of bowel obstruction. No free air or free fluid in the abdomen.  Pelvis: Diverticulosis of the sigmoid colon without evidence of diverticulitis. Bladder wall is not thickened. Uterus and ovaries are not enlarged. Appendix is normal. No free or loculated pelvic fluid collections. No pelvic mass or lymphadenopathy. Bones: Degenerative changes in the thoracic and lumbar spine with normal alignment. No destructive bone lesions are identified.  IMPRESSION: 4.8 cm mass in the superior segment right lower lung likely represents primary neoplasm. Additional small nodular ground-glass lesions in both lungs may represent  metastasis. Probable right hilar lymph node metastasis.  Diffuse fatty infiltration of the liver. No evidence of metastatic disease in the abdomen or pelvis.   Electronically Signed   By: Lucienne Capers M.D.   On: 03/29/2014 02:04   ASSESSMENT / PLAN:  69 year old female with a large lung mass who presents with neurologic symptoms and multiple brain masses as above.  Given history, lung primary is most likely with metastasis. Plan on bronch today with biopsy, brush and BAL under fluoro.  Will send sample to labs, patient informed that hemoptysis is likely, will f/u.  Rush Farmer, M.D. [facility]. Pager: [phone]. After hours pager: [phone].  03/30/2014, 9:24 AM

## 2014-03-30 NOTE — Progress Notes (Signed)
PT Cancellation Note  Patient Details Name: Lori Park MRN: [id number] DOB: [birth year]   Cancelled Treatment:    Reason Eval/Treat Not Completed: Patient at procedure or test/unavailable. Patient went for broncoscopy this morning and still out of room this morning. Will check on later as schedule allows   Ulrich Soules, Tonia Brooms 03/30/2014, 10:30 AM

## 2014-03-30 NOTE — Progress Notes (Signed)
PROGRESS NOTE  Lori Park HYI:[id number] DOB: August 03, 1945 DOA: 03/28/2014 PCP: Shirline Frees, MD  HPI: [name] is a 69 y.o. Caucasian female with past medical history of hypertension and dyslipidemia, admitted with dizziness, poor balance, found to have brain metastases and probable lung cancer.   Subjective - Today she is feeling ok, but has not done much walking since she was having her bronchoscopy procedure and now awaiting PT.  When she goes home, the patient would like to have a walker and chair for the shower.  She seems nervous about stability.  Assessment/Plan: Brain Metastases - Patient's initial complaint of dizziness, trouble finding words, and some memory problems - CT head w/o contrast showed "Multiple expansile hypodensities in the supra and infratentorial brain, largest in LEFT frontal lobe inferring underlying masses, concerning for metastatic disease (less likely infection)." - MRI brain: "At least 4 infratentorial and greater than 14 supratentorial cystic metastasis. Surrounding vasogenic edema, with pontine edema resulting in mild pre pontine cistern effacement. No midline shift. Remote small RIGHT cerebellar infarcts." - Performed CT chest showed 4.8 cm mass, likely this is the primary cancer. - Bronchoscopy performed 03/30/14 for tissue diagnosis. Lung primary is suspected. - Oncology will proceed with recommendations once the result becomes available. - Continue Decadron 4 mg IV four times a day for now, when discharged change to 8mg  bid and taper to 4mg  bid after 5 days. - appointment was made with Dr. Pablo Ledger on March 31, 2014 at 1315 pm  Right Lung Mass - Likely the primary lesion that led to brain mets. - Patient has 30 pack-year smoking history; quit 9 years ago. - CT chest: 4.8 x 4.5 cm mass in the superior segment of the right lower lung with air bronchograms. Prominent right hilar lymph node measuring 12 mm is probably metastatic. This  likely represents primary lung cancer. Dominant metastasis not entirely excluded. There are vague nodular ground-glass lesions demonstrated both lungs, measuring less than 1 cm diameter. These are likely metastatic.  - CT abdomen with no relevant findings. - Continue telemetry and neuro checks. - Consult oncology, interventional radiology, and pulmonary CCM. - Bronchoscopy performed 03/30/14 for tissue diagnosis. Lung primary is suspected. - Oncology will proceed with recommendations once the result becomes available. - Continue regular diet; restart NPO at midnight. - If discharged, appointment was made with Dr. Pablo Ledger on March 31, 2014 at 1315 pm  Hypertension - Lisinopril ordered to be restarted 03/30/14  - BP 142/82 1/27 a.m.  Hyperlipidemia - Discontinued home Pravachol (Pravastatin). - Simvastatin ordered to be started 03/30/14   DVT Prophylaxis: SCDs  Code Status: Full Family Communication: Husband and daughter at bedside Disposition Plan: Home when she feels stable enough  Consultants:  Oncology  Procedures:  Bronchoscopy 03/30/14: results pending  Antibiotics: None   Objective: Filed Vitals:   03/30/14 0955 03/30/14 1000 03/30/14 1010 03/30/14 1020  BP: 135/77 140/79 137/72 138/79  Pulse: 78 79 78 84  Temp:      TempSrc:      Resp: 18 19 20 17   Height:      Weight:      SpO2: 91% 94% 95% 96%    Intake/Output Summary (Last 24 hours) at 03/30/14 1206 Last data filed at 03/29/14 1800  Gross per 24 hour  Intake    780 ml  Output      0 ml  Net    780 ml   Filed Weights   03/29/14 0228  Weight: 77.565 kg (171 lb)  Exam: General: Well developed, well nourished, NAD, appears stated age, laying in bed, calm and comfortable  HEENT:  Anicteic Sclera, MMM.  Neck: Supple, no JVD, no masses  Cardiovascular: RRR, S1 S2 auscultated, no rubs, murmurs or gallops.   Respiratory: Clear to auscultation bilaterally with equal chest rise  Abdomen: Soft, nontender,  nondistended, + bowel sounds  Extremities: warm dry without cyanosis clubbing or edema.  Neuro: AAOx3, strength 5/5 in lower extremities  Psych: Normal affect and demeanor with intact judgement and insight  Data Reviewed: Basic Metabolic Panel:  Recent Labs Lab 03/28/14 1900 03/30/14 0447  NA 137 138  K 3.6 4.5  CL 99 105  CO2 27 20  GLUCOSE 119* 152*  BUN 9 11  CREATININE 0.80 0.83  CALCIUM 10.1 9.3   Liver Function Tests:  Recent Labs Lab 03/28/14 1900 03/30/14 0447  AST 33 27  ALT 29 23  ALKPHOS 49 37*  BILITOT 1.0 1.1  PROT 7.7 6.8  ALBUMIN 4.7 4.0   CBC:  Recent Labs Lab 03/28/14 1900 03/30/14 0447  WBC 6.3 7.3  NEUTROABS 3.3 5.8  HGB 13.5 13.0  HCT 40.5 39.0  MCV 103.6* 101.6*  PLT 349 359   Studies: Ct Head Wo Contrast  03/28/2014   CLINICAL DATA:  Dizziness for 4-5 days, forgetfulness. History of hypertension and hyperlipidemia.  EXAM: CT HEAD WITHOUT CONTRAST  TECHNIQUE: Contiguous axial images were obtained from the base of the skull through the vertex without intravenous contrast.  COMPARISON:  None.  FINDINGS: Expansile vasogenic edema in LEFT upper lobe with mild mass effect on the LEFT lateral ventricle, no hydrocephalus. In addition, LEFT posterior frontal lobe encephalomalacia. Additional expansile hypodensities in the pons, RIGHT brachium pontis. Mild expansile hypodensity in RIGHT parietal lobe. No intraparenchymal hemorrhage. No acute large vascular territory infarct.  No abnormal extra-axial fluid collections. Basal cisterns are patent. Mild calcific atherosclerosis of the carotid siphons.  No skull fracture. The included ocular globes and orbital contents are non-suspicious. The mastoid aircells and included paranasal sinuses are well-aerated.  IMPRESSION: Multiple expansile hypodensities in the supra and infratentorial brain, largest in LEFT frontal lobe inferring underlying masses, concerning for metastatic disease (less likely infection) and  would be better characterized on MRI of the brain with contrast.  Acute findings discussed with and reconfirmed by Dr.MELANIE BELFI on 03/28/2014 at 9:40 pm.   Electronically Signed   By: Elon Alas   On: 03/28/2014 21:41   Ct Chest W Contrast  03/29/2014   CLINICAL DATA:  Brain metastases.  Evaluate for primary source.  EXAM: CT CHEST, ABDOMEN, AND PELVIS WITH CONTRAST  TECHNIQUE: Multidetector CT imaging of the chest, abdomen and pelvis was performed following the standard protocol during bolus administration of intravenous contrast.  CONTRAST:  133mL OMNIPAQUE IOHEXOL 300 MG/ML  SOLN  COMPARISON:  CT chest 09/08/2007  FINDINGS: CT CHEST FINDINGS  Normal heart size. Normal caliber thoracic aorta. Great vessel origins are patent. Calcification of the aorta and Coronary arteries. Esophagus is decompressed.  Mass in the superior segment of the right lower lung with air bronchograms. The mass lesion measures about 4.8 x 4.5 cm. Prominent right hilar lymph node measuring 12 mm is probably metastatic. This likely represents primary lung cancer. Dominant metastasis not entirely excluded. There are vague nodular ground-glass lesions demonstrated both lungs, measuring less than 1 cm diameter. These are likely metastatic. Focal areas scarring in the right middle lung is unchanged since prior study. No pneumothorax. No pleural effusions. Airways appear patent.  CT ABDOMEN AND PELVIS FINDINGS  Diffuse fatty infiltration of the liver. No focal liver lesions appreciated. The gallbladder, pancreas, spleen, adrenal glands, kidneys, inferior vena cava, and retroperitoneal lymph nodes are unremarkable. Calcification of the abdominal aorta without aneurysm. Small accessory spleen. Stomach, small bowel, and colon appear normal for degree of distention. Contrast material flows through to the colon without evidence of bowel obstruction. No free air or free fluid in the abdomen.  Pelvis: Diverticulosis of the sigmoid colon  without evidence of diverticulitis. Bladder wall is not thickened. Uterus and ovaries are not enlarged. Appendix is normal. No free or loculated pelvic fluid collections. No pelvic mass or lymphadenopathy. Bones: Degenerative changes in the thoracic and lumbar spine with normal alignment. No destructive bone lesions are identified.  IMPRESSION: 4.8 cm mass in the superior segment right lower lung likely represents primary neoplasm. Additional small nodular ground-glass lesions in both lungs may represent metastasis. Probable right hilar lymph node metastasis.  Diffuse fatty infiltration of the liver. No evidence of metastatic disease in the abdomen or pelvis.   Electronically Signed   By: Lucienne Capers M.D.   On: 03/29/2014 02:04   Mr Jeri Cos AY Contrast  03/28/2014   CLINICAL DATA:  Four-day history of dizziness, lightheadedness, speech difficulties. Gait instability.  EXAM: MRI HEAD WITHOUT AND WITH CONTRAST  TECHNIQUE: Multiplanar, multiecho pulse sequences of the brain and surrounding structures were obtained without and with intravenous contrast.  CONTRAST:  57mL MULTIHANCE GADOBENATE DIMEGLUMINE 529 MG/ML IV SOLN  COMPARISON:  CT of the head March 28, 2014 at 2116 hr  FINDINGS: No reduced diffusion to suggest acute ischemia, hypercellular large tumor or purulent material. No susceptibility artifact to suggest hemorrhage.  At least 4 infratentorially greater than 14 supratentorial intermediate FLAIR signal, T2 hyperintense peripherally enhancing cystic metastasis seen at the gray-white matter junction. Surrounding T2 bright vasogenic edema, most apparent within the LEFT frontal lobe with sulcal effacement, no midline shift. Mild T2 bright vasogenic edema within the pons partially effaces the pre pontine cistern, with mild mass effect on the basilar artery. Mild white matter changes of exclusive of the aforementioned bowel abnormality most consistent chronic small vessel ischemic disease.  Small remote  RIGHT cerebellar infarcts. No abnormal extra-axial fluid collections. No suspicious dural, leptomeningeal enhancement. No extra-axial masses. Major intracranial vascular flow voids are preserved at the skullbase.  No paranasal sinus air-fluid levels. Trace fluid signal in the RIGHT mastoid air cells.  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 Surrounding vasogenic edema, with pontine edema resulting in mild pre pontine cistern effacement. No midline shift.  Remote small RIGHT cerebellar infarcts.   Electronically Signed   By: Elon Alas   On: 03/28/2014 23:42   Ct Abdomen Pelvis W Contrast  03/29/2014   CLINICAL DATA:  Brain metastases.  Evaluate for primary source.  EXAM: CT CHEST, ABDOMEN, AND PELVIS WITH CONTRAST  TECHNIQUE: Multidetector CT imaging of the chest, abdomen and pelvis was performed following the standard protocol during bolus administration of intravenous contrast.  CONTRAST:  171mL OMNIPAQUE IOHEXOL 300 MG/ML  SOLN  COMPARISON:  CT chest 09/08/2007  FINDINGS: CT CHEST FINDINGS  Normal heart size. Normal caliber thoracic aorta. Great vessel origins are patent. Calcification of the aorta and Coronary arteries. Esophagus is decompressed.  Mass in the superior segment of the right lower lung with air bronchograms. The mass lesion measures about 4.8 x 4.5 cm. Prominent right hilar lymph node measuring 12 mm is probably metastatic.  This likely represents primary lung cancer. Dominant metastasis not entirely excluded. There are vague nodular ground-glass lesions demonstrated both lungs, measuring less than 1 cm diameter. These are likely metastatic. Focal areas scarring in the right middle lung is unchanged since prior study. No pneumothorax. No pleural effusions. Airways appear patent.  CT ABDOMEN AND PELVIS FINDINGS  Diffuse fatty infiltration of the liver. No focal liver lesions appreciated. The gallbladder, pancreas, spleen, adrenal glands,  kidneys, inferior vena cava, and retroperitoneal lymph nodes are unremarkable. Calcification of the abdominal aorta without aneurysm. Small accessory spleen. Stomach, small bowel, and colon appear normal for degree of distention. Contrast material flows through to the colon without evidence of bowel obstruction. No free air or free fluid in the abdomen.  Pelvis: Diverticulosis of the sigmoid colon without evidence of diverticulitis. Bladder wall is not thickened. Uterus and ovaries are not enlarged. Appendix is normal. No free or loculated pelvic fluid collections. No pelvic mass or lymphadenopathy. Bones: Degenerative changes in the thoracic and lumbar spine with normal alignment. No destructive bone lesions are identified.  IMPRESSION: 4.8 cm mass in the superior segment right lower lung likely represents primary neoplasm. Additional small nodular ground-glass lesions in both lungs may represent metastasis. Probable right hilar lymph node metastasis.  Diffuse fatty infiltration of the liver. No evidence of metastatic disease in the abdomen or pelvis.   Electronically Signed   By: Lucienne Capers M.D.   On: 03/29/2014 02:04   Dg C-arm Bronchoscopy  03/30/2014   CLINICAL DATA:    C-ARM BRONCHOSCOPY  Fluoroscopy was utilized by the requesting physician.  No radiographic  interpretation.     Scheduled Meds: . dexamethasone  4 mg Intravenous 4 times per day  . lisinopril  10 mg Oral Daily  . sertraline  25 mg Oral Daily  . simvastatin  20 mg Oral Daily   Continuous Infusions: . sodium chloride 50 mL/hr at 03/29/14 2240  . sodium chloride 10 mL/hr at 03/30/14 3428    Principal Problem:   Brain metastases Active Problems:   Dizziness   Vasogenic cerebral edema   Family history of colon cancer   Metastatic cancer to brain of unknown cell type   Primary cancer of unknown site   Lung mass  Chrys Racer E. Howell-Methvin, PA-S    Costin M. Cruzita Lederer, MD [facility] [phone] If  7PM-7AM, please contact night-coverage at [email or web address], password [facility] 03/30/2014, 12:06 PM  LOS: 2 days

## 2014-03-30 NOTE — Telephone Encounter (Signed)
Spoke with Mardene Celeste RN to make arrangements for patient to come by [facility] on 03/31/14 between 1:30 pm and 2:00pm for consultation and simulation if not discharged.I told her I would check status around 9:00 am as I spoke with her doctor whom will round patient at 8:00 am tomorrow.Rm [phone].

## 2014-03-30 NOTE — Procedures (Addendum)
Bronchoscopy Procedure Note Doyce Stonehouse [id number] [birth year]  Procedure: Bronchoscopy Indications: Diagnostic evaluation of the airways and Obtain specimens for culture and/or other diagnostic studies  Procedure Details Consent: Risks of procedure as well as the alternatives and risks of each were explained to the (patient/caregiver).  Consent for procedure obtained. Time Out: Verified patient identification, verified procedure, site/side was marked, verified correct patient position, special equipment/implants available, medications/allergies/relevent history reviewed, required imaging and test results available.  Performed  In preparation for procedure, bronchoscope lubricated and Fentanyl 100 mcg and Versed 4 mg given as well as topical lidocaine.. Sedation: Benzodiazepines and Fentanyl   Patient was given 4 instead of 2 mg of versed by me inadvertently, reversal agent given, patient suffered no complications.  Airway entered and the following bronchi were examined: RUL, RML, RLL, LUL and LLL.   Procedures performed: Brushings performed from RLL superior segment. Biopsy x3 done under fluoro from RLL superior segment. BAL from RLL superior segment. Bronchoscope removed.    Evaluation Hemodynamic Status: BP stable throughout; O2 sats: stable throughout Patient's Current Condition: stable Specimens:  Sent serosanguinous fluid Complications: No apparent complications Patient did tolerate procedure well.   Jennet Maduro 03/30/2014

## 2014-03-30 NOTE — Progress Notes (Signed)
Occupational Therapy Treatment Patient Details Name: Lori Park MRN: [id number] DOB: [birth year] Today's Date: 03/30/2014    History of present illness Patient presents with a four-day history of dizziness. She feels a little bit lightheaded. There is no vertiginous-type symptoms. She's had trouble getting her words out. She states that she feels like she knows what she wants to say but has trouble forming her words. She states her balance has been off and she's been stumbling. She feels like her coordination is off. 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   OT comments  Patient progressing towards OT goals. Continue plan of care for now. Husband and daughter present and aware of recommended 24/7 supervision/assistance at discharge.    Follow Up Recommendations  No OT follow up;Supervision/Assistance - 24 hour    Equipment Recommendations  3 in 1 bedside comode    Recommendations for Other Services  None at this time    Precautions / Restrictions Precautions Precautions: Fall Restrictions Weight Bearing Restrictions: No       Mobility Transfers Overall transfer level: Needs assistance   Transfers: Sit to/from Stand Sit to Stand: Min guard    Balance Standing balance support: No upper extremity supported Standing balance-Leahy Scale: Poor    ADL Overall ADL's : Needs assistance/impaired Eating/Feeding: Independent;Sitting   Grooming: Wash/dry hands;Wash/dry face;Oral care;Standing;Min guard       Lower Body Bathing: Min guard;Sit to/from stand;Cueing for safety   Upper Body Dressing : Min guard;Standing   Lower Body Dressing: Min guard;Cueing for safety;Sit to/from stand   Toilet Transfer: Min guard;Ambulation;Cueing for safety   Toileting- Clothing Manipulation and Hygiene: Min guard;Cueing for safety;Sit to/from stand       Functional mobility during ADLs: Min guard General ADL Comments:  Patient continues to present with slow processing during this OT treat. Attempted to use RW and patient with LOB, patient seemed to do better without RW and min guard assist from therapist. Patient's husband and daughter present, therapist educated both on importance of 24/7 supervision/assistance once discharged secondary to patient's decreased balance, decreased safety awarness, and slow processing; all agreed.                 Cognition   Behavior During Therapy: WFL for tasks assessed/performed Overall Cognitive Status: Impaired/Different from baseline Area of Impairment: Safety/judgement;Awareness;Attention   Current Attention Level: Sustained    Following Commands: Follows multi-step commands with increased time Safety/Judgement: Decreased awareness of safety;Decreased awareness of deficits Awareness: Emergent Problem Solving: Slow processing;Decreased initiation;Requires verbal cues General Comments: Discussed patient's cognitive status with family and recommendation of 24/7 supervision/assitance once discharged > home                 Pertinent Vitals/ Pain       Pain Assessment: No/denies pain         Frequency Min 2X/week     Progress Toward Goals  OT Goals(current goals can now be found in the care plan section)  Progress towards OT goals: Progressing toward goals     Plan Discharge plan remains appropriate       End of Session Equipment Utilized During Treatment: Gait belt   Activity Tolerance Patient tolerated treatment well   Patient Left in chair;with call bell/phone within reach;with family/visitor present     Time: 1136-1202 OT Time Calculation (min): 26 min  Charges: OT General Charges $OT Visit: 1 Procedure OT Treatments $Self Care/Home Management : 8-22 mins $Therapeutic Activity: 8-22 mins  Alyssa Rotondo , MS, OTR/L, CLT Pager: [phone]  03/30/2014, 12:09 PM

## 2014-03-30 NOTE — Progress Notes (Cosign Needed)
Video Bronchoscopy Performed  Bronchial Washing intervention done Biopsy Intervention done Brushing Intervention done   Pt tolerated well

## 2014-03-31 ENCOUNTER — Ambulatory Visit
Admit: 2014-03-31 | Discharge: 2014-03-31 | Disposition: A | Discharge status: Home / Self Care | Payer: PPO | Attending: Radiation Oncology | Admitting: Radiation Oncology

## 2014-03-31 ENCOUNTER — Telehealth: Payer: Self-pay | Admitting: Oncology

## 2014-03-31 ENCOUNTER — Encounter (HOSPITAL_COMMUNITY): Payer: Self-pay | Admitting: Pulmonary Disease

## 2014-03-31 VITALS — BP 143/97 | HR 89 | Temp 97.7°F | Resp 20 | Wt 170.8 lb

## 2014-03-31 DIAGNOSIS — Z510 Encounter for antineoplastic radiation therapy: Secondary | ICD-10-CM | POA: Insufficient documentation

## 2014-03-31 DIAGNOSIS — C801 Malignant (primary) neoplasm, unspecified: Secondary | ICD-10-CM | POA: Insufficient documentation

## 2014-03-31 DIAGNOSIS — Z87891 Personal history of nicotine dependence: Secondary | ICD-10-CM | POA: Insufficient documentation

## 2014-03-31 DIAGNOSIS — C7931 Secondary malignant neoplasm of brain: Secondary | ICD-10-CM

## 2014-03-31 DIAGNOSIS — R911 Solitary pulmonary nodule: Secondary | ICD-10-CM | POA: Insufficient documentation

## 2014-03-31 MED ORDER — LISINOPRIL 10 MG PO TABS: 10.0000 mg | ORAL_TABLET | Freq: Every day | ORAL | Status: AC

## 2014-03-31 MED ORDER — DEXAMETHASONE 4 MG PO TABS: 8.0000 mg | ORAL_TABLET | Freq: Two times a day (BID) | ORAL | Status: DC

## 2014-03-31 NOTE — Progress Notes (Signed)
Physical Therapy Treatment Patient Details Name: Lori Park MRN: [id number] DOB: [birth year] Today's Date: 03/31/2014    History of Present Illness Patient presents with a four-day history of dizziness. She feels a little bit lightheaded. There is no vertiginous-type symptoms. She's had trouble getting her words out. She states that she feels like she knows what she wants to say but has trouble forming her words. She states her balance has been off and she's been stumbling. She feels like her coordination is off. 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    PT Comments    Patient progressing well. Did practice steps as she does have them in the house. Patient is planning to DC today and family requesting use of RW for safety which patient would benefit due to right drift. Patient and family asking several questions about home safety and what to expect. Answered as I was able and other question deferred to MD at this time.   Follow Up Recommendations  Home health PT     Equipment Recommendations  Rolling walker with 5" wheels    Recommendations for Other Services       Precautions / Restrictions Precautions Precautions: Fall    Mobility  Bed Mobility Overal bed mobility: Independent                Transfers Overall transfer level: Modified independent                  Ambulation/Gait Ambulation/Gait assistance: Supervision Ambulation Distance (Feet): 300 Feet Assistive device: None Gait Pattern/deviations: Drifts right/left   Gait velocity interpretation: at or above normal speed for age/gender General Gait Details: Continues with slight drfit but able to self control with no LOB   Stairs Stairs: Yes Stairs assistance: Min guard Stair Management: Step to pattern;Forwards;No rails Number of Stairs: 5 General stair comments: Minguard for safety. Slight LOB x1 with min A to correct  Wheelchair  Mobility    Modified Rankin (Stroke Patients Only)       Balance                                    Cognition Arousal/Alertness: Awake/alert Behavior During Therapy: WFL for tasks assessed/performed Overall Cognitive Status: Within Functional Limits for tasks assessed           Safety/Judgement: Decreased awareness of deficits   Problem Solving: Slow processing;Decreased initiation;Requires verbal cues      Exercises      General Comments        Pertinent Vitals/Pain Pain Assessment: No/denies pain    Home Living                      Prior Function            PT Goals (current goals can now be found in the care plan section) Progress towards PT goals: Progressing toward goals    Frequency  Min 4X/week    PT Plan Current plan remains appropriate    Co-evaluation             End of Session   Activity Tolerance: Patient tolerated treatment well Patient left: in chair;with call bell/phone within reach;with family/visitor present     Time: 0920-0943 PT Time Calculation (min) (ACUTE ONLY): 23 min  Charges:  $Gait Training: 8-22 mins $Therapeutic Activity: 8-22 mins  G Codes:      Jacqualyn Posey 03/31/2014, 9:49 AM 03/31/2014 Jacqualyn Posey PTA [phone] pager [phone] office

## 2014-03-31 NOTE — Progress Notes (Signed)
Thoracic Location of Tumor / Histology:Lung mass 45.8 cm superior segment right lower lung with brain mets  Patient presented to hospital with dizziness, light-headedness x 5 days but not feeling well for 1 month. Mental status changes and unsteady gait.  Biopsies of lung:Awaiting pathology   Tobacco/Marijuana/Snuff/ETOH use: Quit smoking 03/29/2004 post 1 ppd for 30 years  Past/Anticipated interventions by cardiothoracic surgery, if any: 03/30/2014 Bronchoscopy with fluoroscopy by Dr.Wessam Nelda Marse[place]  Past/Anticipated interventions by medical oncology, if any: Follow up with Dr.Sherrill on 04/04/14  Signs/Symptoms  Weight changes, if any: No  Respiratory complaints, if any:No  Hemoptysis, if any: No  Pain issues, if any: No  SAFETY ISSUES:  Prior radiation? No  Pacemaker/ICD? No   Possible current pregnancy?No  Is the patient on methotrexate? No  Current Complaints / other details:Married, one daughter visiting from [place] Only surgical history is breast cyst removal over 40 years ago.  NKDA

## 2014-03-31 NOTE — Progress Notes (Signed)
[facility] Simulation/Treatment Planning/IMRT note   Lori Park  [id number] 03/31/2014   CONSENT VERIFIED:yes   SET UP: Patient is set-up supine   IMMOBILIZATION: The following immobilization is used:Aquaplast Mask   NARRATIVE:The patient was brought to the [facility].  Identity was confirmed.  All relevant records and images related to the planned course of therapy were reviewed.  Then, the patient was positioned in a stable reproducible clinical set-up for radiation therapy using an aquaplast mask.    Marks were made on the mask. The CT images were loaded into the planning software where the target and avoidance structures were contoured.   The radiation prescription was entered and confirmed.   TREATMENT PLANNING NOTE:  Treatment planning then occurred. I have requested : 3D planning with DVH of the gtv, normal brain and globes.   I personally supervised and approved the construction of 4 unique MLCS for beam modification purposes.   5 complex treatment devices will be used.

## 2014-03-31 NOTE — Care Management Note (Signed)
    Page 2 of 2   03/31/2014     10:25:17 AM CARE MANAGEMENT NOTE 03/31/2014  Patient:  Lori Park,Lori Park   Account Number:  [id number]  Date Initiated:  03/29/2014  Documentation initiated by:  ROBARGE,COURTNEY  Subjective/Objective Assessment:   Patient was admitted with brain mets. Lives at home with spouse.     Action/Plan:   Will follow for discharge needs pending PT/OT evals and physician orders.   Anticipated DC Date:  03/31/2014   Anticipated DC Plan:  HOME W HOME HEALTH SERVICES      DC Planning Services  CM consult      Choice offered to / List presented to:  C-1 Patient   DME arranged  3-N-1  WALKER - ROLLING      DME agency  [facility]     HH arranged  HH-2 PT      HH agency  [facility]   Status of service:  Completed, signed off Medicare Important Message given?   (If response is "NO", the following Medicare IM given date fields will be blank) Date Medicare IM given:   Medicare IM given by:   Date Additional Medicare IM given:   Additional Medicare IM given by:    Discharge Disposition:  HOME W HOME HEALTH SERVICES  Per UR Regulation:  Reviewed for med. necessity/level of care/duration of stay  If discussed at Long Length of Stay Meetings, dates discussed:    Comments:  03/31/14 0945 Courtney Robarge RN, MSN, CM- Met with patient to discuss home health needs. Patient has chosen [facility].  Miranda with [facility] was notified and has accepted the referral for discharge home today.  [facility] DME was notified of equipment needs.  Demographics were verified in the chart. Please add husband Bob [phone] as a secondary number for appointments   

## 2014-03-31 NOTE — Discharge Summary (Signed)
Physician Discharge Summary  Jonna Dittrich ZTI:[id number] DOB: [birth year] DOA: 03/28/2014  PCP: Shirline Frees, MD  Admit date: 03/28/2014 Discharge date: 03/31/2014  Time spent: 65 minutes  Recommendations for Outpatient Follow-up:  1. Follow up with Dr. Pablo Ledger in Radiation Oncology today   2. Follow up with Dr. 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 female with Past medical history of hypertension and dyslipidemia, motor disorder. The patient presented with complaints of dizziness and lightheadedness. She mentions that since last one month she has had a sense of "not feeling well". a month ago she was seen in urgent care at which time she mention this to her urgent care provider. Since last 5 days she has been having complaints of dizziness. She initially noted the symptoms when she was trying to get out of the bed and was having difficulty maintaining balance. Over the weekend she felt better but then yesterday again she started having complaints of dizziness and lightheadedness. She was having difficulty remembering short-term events. There was no fall no trauma no staring episode no loss of consciousness no loss of bowel or bladder control no chest pain no shortness of breath no fever no chills no cough no congestion no nausea no vomiting no diarrhea no burning urination. There is no recent change in her medication. Patient is unable to verify whether she is taking Zoloft or Celexa.  The patient is coming from home. And at her baseline independent for most of her  ADL.  Hospital Course:  Brain Metastases - Patient's initial complaint of dizziness, trouble finding words, and mild memory problems - CT head w/o contrast showed "Multiple expansile hypodensities in the supra and infratentorial brain, largest in LEFT frontal lobe inferring underlying masses, concerning for metastatic disease (less likely infection)." - her CT scan was followed by MRI brain which showed"At least 4 infratentorial and greater than 14 supratentorial cystic metastasis. Surrounding vasogenic edema, with pontine edema resulting in mild pre pontine cistern effacement. No midline shift. Remote small RIGHT cerebellar infarcts." - Performed CT chest showed 4.8 cm mass, likely this is the primary cancer. - Bronchoscopy performed 03/30/14 for tissue diagnosis, pathology pending - Oncology will proceed with recommendations once the result becomes available and patient will be seen by Dr. Benay Spice next week. - Continue Decadron 4 mg IV four times a day for now to transition to 8mg  bid and taper to 4mg  bid after 7 days following discharge - Patient has an appointment with Dr. Pablo Ledger from Radiation Oncology on the day of discharge, March 31, 2014 at 1315 pm Right Lung Mass - Patient has 30 pack-year smoking history; quit 9 years ago, likely primary lung neoplasm - CT chest: 4.8 x 4.5 cm mass in the superior segment of the right lower lung with air bronchograms. Prominent right hilar lymph node measuring 12 mm is probably metastatic. This likely represents primary lung cancer. Dominant metastasis not entirely excluded. There are vague nodular ground-glass lesions demonstrated both lungs, measuring less than 1 cm diameter. These are likely metastatic.  - CT  abdomen with no relevant findings. - Bronchoscopy performed 03/30/14 for tissue diagnosis by PCCM Hypertension - Lisinopril ordered to be restarted 03/30/14  Hyperlipidemia - continue statin  Procedures:  Bronchoscopy 1/27    Consultations:  PCCM  Oncology  Discharge Exam: Filed Vitals:   03/30/14 1737 03/30/14 2222 03/31/14 0131 03/31/14 0646  BP: 126/82 137/76 128/81 142/80  Pulse: 97 80 70 74  Temp: 97.8 F (36.6 C) 97.5 F (36.4 C) 98.3 F (36.8 C) 98.7 F (37.1 C)  TempSrc: Oral Axillary Oral Oral  Resp: 20 18 20 20   Height:      Weight:      SpO2: 95% 98% 97% 98%   General: NAD Cardiovascular: RRR Respiratory: CTA biL  Discharge Instructions     Medication List    TAKE these medications        aspirin EC 81 MG tablet  Take 81 mg by mouth at bedtime.     citalopram 20 MG tablet  Commonly known as:  CELEXA  Take 20 mg by mouth daily.     dexamethasone 4 MG tablet  Commonly known as:  DECADRON  Take 2 tablets (8 mg total) by mouth 2 (two) times daily. For 7 days then 1 tablet (4 mg) twice daily.     Fish Oil 600 MG Caps  Take 1,200 mg by mouth 2 (two) times daily.     lisinopril 10 MG tablet  Commonly known as:  PRINIVIL,ZESTRIL  Take 1 tablet (10 mg total) by mouth daily.     niacin 1000 MG CR tablet  Commonly known as:  NIASPAN  Take 1,000 mg by mouth at bedtime.     REFRESH OP  Place 2 drops into both eyes daily as needed (dry eyes).     simvastatin 20 MG tablet  Commonly known as:  ZOCOR  Take 20 mg by mouth daily.     Vitamin D3 2000 UNITS Tabs  Take 2,000 Units by mouth daily.     vitamin E 400 UNIT capsule  Take 400 Units by mouth daily.           Follow-up Information    Follow up with Shirline Frees, MD. Schedule an appointment as soon as possible for a visit in 2 months.   Specialty:  Family Medicine   Contact information:   [place] [address] [phone]       Follow up with Betsy Coder, MD In 1 week.   Specialty:  Oncology   Contact information:   [place] [address] [phone]       Follow up with Thea Silversmith, MD Today.   Specialty:  Radiation Oncology   Contact  information:   [address] [place] [address] [phone]       The results of significant diagnostics from this hospitalization (including imaging, microbiology, ancillary and laboratory) are listed below for reference.    Significant Diagnostic Studies: Ct Head Wo Contrast  03/28/2014   CLINICAL DATA:  Dizziness for 4-5 days, forgetfulness. History of hypertension and hyperlipidemia.  EXAM: CT HEAD WITHOUT CONTRAST  TECHNIQUE: Contiguous axial images were obtained from the base of the skull through the vertex without intravenous contrast.  COMPARISON:  None.  FINDINGS: Expansile vasogenic edema in LEFT upper lobe with mild mass effect on the LEFT lateral ventricle, no hydrocephalus. In addition, LEFT posterior frontal lobe encephalomalacia. Additional expansile hypodensities in the pons, RIGHT brachium pontis. Mild expansile hypodensity in RIGHT parietal  lobe. No intraparenchymal hemorrhage. No acute large vascular territory infarct.  No abnormal extra-axial fluid collections. Basal cisterns are patent. Mild calcific atherosclerosis of the carotid siphons.  No skull fracture. The included ocular globes and orbital contents are non-suspicious. The mastoid aircells and included paranasal sinuses are well-aerated.  IMPRESSION: Multiple expansile hypodensities in the supra and infratentorial brain, largest in LEFT frontal lobe inferring underlying masses, concerning for metastatic disease (less likely infection) and would be better characterized on MRI of the brain with contrast.  Acute findings discussed with and reconfirmed by Dr.MELANIE BELFI on 03/28/2014 at 9:40 pm.   Electronically Signed   By: Elon Alas   On: 03/28/2014 21:41   Ct Chest W Contrast  03/29/2014   CLINICAL DATA:  Brain metastases.  Evaluate for primary source.  EXAM: CT CHEST, ABDOMEN, AND PELVIS WITH CONTRAST  TECHNIQUE: Multidetector CT imaging of the chest, abdomen and pelvis was performed following the standard  protocol during bolus administration of intravenous contrast.  CONTRAST:  129mL OMNIPAQUE IOHEXOL 300 MG/ML  SOLN  COMPARISON:  CT chest 09/08/2007  FINDINGS: CT CHEST FINDINGS  Normal heart size. Normal caliber thoracic aorta. Great vessel origins are patent. Calcification of the aorta and Coronary arteries. Esophagus is decompressed.  Mass in the superior segment of the right lower lung with air bronchograms. The mass lesion measures about 4.8 x 4.5 cm. Prominent right hilar lymph node measuring 12 mm is probably metastatic. This likely represents primary lung cancer. Dominant metastasis not entirely excluded. There are vague nodular ground-glass lesions demonstrated both lungs, measuring less than 1 cm diameter. These are likely metastatic. Focal areas scarring in the right middle lung is unchanged since prior study. No pneumothorax. No pleural effusions. Airways appear patent.  CT ABDOMEN AND PELVIS FINDINGS  Diffuse fatty infiltration of the liver. No focal liver lesions appreciated. The gallbladder, pancreas, spleen, adrenal glands, kidneys, inferior vena cava, and retroperitoneal lymph nodes are unremarkable. Calcification of the abdominal aorta without aneurysm. Small accessory spleen. Stomach, small bowel, and colon appear normal for degree of distention. Contrast material flows through to the colon without evidence of bowel obstruction. No free air or free fluid in the abdomen.  Pelvis: Diverticulosis of the sigmoid colon without evidence of diverticulitis. Bladder wall is not thickened. Uterus and ovaries are not enlarged. Appendix is normal. No free or loculated pelvic fluid collections. No pelvic mass or lymphadenopathy. Bones: Degenerative changes in the thoracic and lumbar spine with normal alignment. No destructive bone lesions are identified.  IMPRESSION: 4.8 cm mass in the superior segment right lower lung likely represents primary neoplasm. Additional small nodular ground-glass lesions in both  lungs may represent metastasis. Probable right hilar lymph node metastasis.  Diffuse fatty infiltration of the liver. No evidence of metastatic disease in the abdomen or pelvis.   Electronically Signed   By: Lucienne Capers M.D.   On: 03/29/2014 02:04   Mr Jeri Cos VW Contrast  03/28/2014   CLINICAL DATA:  Four-day history of dizziness, lightheadedness, speech difficulties. Gait instability.  EXAM: MRI HEAD WITHOUT AND WITH CONTRAST  TECHNIQUE: Multiplanar, multiecho pulse sequences of the brain and surrounding structures were obtained without and with intravenous contrast.  CONTRAST:  80mL MULTIHANCE GADOBENATE DIMEGLUMINE 529 MG/ML IV SOLN  COMPARISON:  CT of the head March 28, 2014 at 2116 hr  FINDINGS: No reduced diffusion to suggest acute ischemia, hypercellular large tumor or purulent material. No susceptibility artifact to suggest hemorrhage.  At least 4 infratentorially greater than 14  supratentorial intermediate FLAIR signal, T2 hyperintense peripherally enhancing cystic metastasis seen at the gray-white matter junction. Surrounding T2 bright vasogenic edema, most apparent within the LEFT frontal lobe with sulcal effacement, no midline shift. Mild T2 bright vasogenic edema within the pons partially effaces the pre pontine cistern, with mild mass effect on the basilar artery. Mild white matter changes of exclusive of the aforementioned bowel abnormality most consistent chronic small vessel ischemic disease.  Small remote RIGHT cerebellar infarcts. No abnormal extra-axial fluid collections. No suspicious dural, leptomeningeal enhancement. No extra-axial masses. Major intracranial vascular flow voids are preserved at the skullbase.  No paranasal sinus air-fluid levels. Trace fluid signal in the RIGHT mastoid air cells.  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 Surrounding vasogenic edema, with pontine edema resulting in mild pre pontine cistern  effacement. No midline shift.  Remote small RIGHT cerebellar infarcts.   Electronically Signed   By: Elon Alas   On: 03/28/2014 23:42   Ct Abdomen Pelvis W Contrast  03/29/2014   CLINICAL DATA:  Brain metastases.  Evaluate for primary source.  EXAM: CT CHEST, ABDOMEN, AND PELVIS WITH CONTRAST  TECHNIQUE: Multidetector CT imaging of the chest, abdomen and pelvis was performed following the standard protocol during bolus administration of intravenous contrast.  CONTRAST:  187mL OMNIPAQUE IOHEXOL 300 MG/ML  SOLN  COMPARISON:  CT chest 09/08/2007  FINDINGS: CT CHEST FINDINGS  Normal heart size. Normal caliber thoracic aorta. Great vessel origins are patent. Calcification of the aorta and Coronary arteries. Esophagus is decompressed.  Mass in the superior segment of the right lower lung with air bronchograms. The mass lesion measures about 4.8 x 4.5 cm. Prominent right hilar lymph node measuring 12 mm is probably metastatic. This likely represents primary lung cancer. Dominant metastasis not entirely excluded. There are vague nodular ground-glass lesions demonstrated both lungs, measuring less than 1 cm diameter. These are likely metastatic. Focal areas scarring in the right middle lung is unchanged since prior study. No pneumothorax. No pleural effusions. Airways appear patent.  CT ABDOMEN AND PELVIS FINDINGS  Diffuse fatty infiltration of the liver. No focal liver lesions appreciated. The gallbladder, pancreas, spleen, adrenal glands, kidneys, inferior vena cava, and retroperitoneal lymph nodes are unremarkable. Calcification of the abdominal aorta without aneurysm. Small accessory spleen. Stomach, small bowel, and colon appear normal for degree of distention. Contrast material flows through to the colon without evidence of bowel obstruction. No free air or free fluid in the abdomen.  Pelvis: Diverticulosis of the sigmoid colon without evidence of diverticulitis. Bladder wall is not thickened. Uterus and  ovaries are not enlarged. Appendix is normal. No free or loculated pelvic fluid collections. No pelvic mass or lymphadenopathy. Bones: Degenerative changes in the thoracic and lumbar spine with normal alignment. No destructive bone lesions are identified.  IMPRESSION: 4.8 cm mass in the superior segment right lower lung likely represents primary neoplasm. Additional small nodular ground-glass lesions in both lungs may represent metastasis. Probable right hilar lymph node metastasis.  Diffuse fatty infiltration of the liver. No evidence of metastatic disease in the abdomen or pelvis.   Electronically Signed   By: Lucienne Capers M.D.   On: 03/29/2014 02:04   Dg C-arm Bronchoscopy  03/30/2014   CLINICAL DATA:    C-ARM BRONCHOSCOPY  Fluoroscopy was utilized by the requesting physician.  No radiographic  interpretation.    Labs: Basic Metabolic Panel:  Recent Labs Lab 03/28/14 1900 03/30/14 0447  NA 137 138  K 3.6  4.5  CL 99 105  CO2 27 20  GLUCOSE 119* 152*  BUN 9 11  CREATININE 0.80 0.83  CALCIUM 10.1 9.3   Liver Function Tests:  Recent Labs Lab 03/28/14 1900 03/30/14 0447  AST 33 27  ALT 29 23  ALKPHOS 49 37*  BILITOT 1.0 1.1  PROT 7.7 6.8  ALBUMIN 4.7 4.0   CBC:  Recent Labs Lab 03/28/14 1900 03/30/14 0447  WBC 6.3 7.3  NEUTROABS 3.3 5.8  HGB 13.5 13.0  HCT 40.5 39.0  MCV 103.6* 101.6*  PLT 349 359    Signed:  GHERGHE, COSTIN  [facility] 03/31/2014, 7:34 AM

## 2014-03-31 NOTE — Progress Notes (Signed)
Please see the Nurse Progress Note in the MD Initial Consult Encounter for this patient. 

## 2014-03-31 NOTE — Progress Notes (Signed)
Radiation Oncology         [phone] ________________________________  Name: Lori Park      MRN: [id number]          Date: 03/31/2014              DOB: 12/23/1945  Optical Surface Tracking Plan:  Since intensity modulated radiotherapy (IMRT) and 3D conformal radiation treatment methods are predicated on accurate and precise positioning for treatment, intrafraction motion monitoring is medically necessary to ensure accurate and safe treatment delivery.  The ability to quantify intrafraction motion without excessive ionizing radiation dose can only be performed with optical surface tracking. Accordingly, surface imaging offers the opportunity to obtain 3D measurements of patient position throughout IMRT and 3D treatments without excessive radiation exposure.  I am ordering optical surface tracking for this patient's upcoming course of radiotherapy. ________________________________ Signature   Reference:   Ursula Alert, J, et al. Surface imaging-based analysis of intrafraction motion for breast radiotherapy patients.Journal of [employer], n. 6, nov. 2014. ISSN [id number].   Available at: <http://www.jacmp.org/index.php/jacmp/article/view/4957>.

## 2014-03-31 NOTE — Telephone Encounter (Signed)
PATIENT SCHEDULED TO SEE DR. [facility] 02/01 @ 12:15

## 2014-03-31 NOTE — Discharge Instructions (Addendum)
Continue taking decadron 8 mg twice daily for the next week then decrease the dose to 4 mg twice daily, and continue taking until further instructed by Lori Park from Radiation Oncology.   Follow with Lori Frees, Lori Park in 1 month  Please get a complete blood count and chemistry panel checked by your Primary Lori Park at your next visit, and again as instructed by your Primary Lori Park. Please get your medications reviewed and adjusted by your Primary Lori Park.  Please request your Primary Lori Park to go over all Hospital Tests and Procedure/Radiological results at the follow up, please get all Hospital records sent to your Prim Lori Park by signing hospital release before you go home.  If you had Pneumonia of Lung problems at the Hospital: Please get a 2 view Chest X ray done in 6-8 weeks after hospital discharge or sooner if instructed by your Primary Lori Park.  If you have Congestive Heart Failure: Please call your Cardiologist or Primary Lori Park anytime you have any of the following symptoms:  1) 3 pound weight gain in 24 hours or 5 pounds in 1 week  2) shortness of breath, with or without a dry hacking cough  3) swelling in the hands, feet or stomach  4) if you have to sleep on extra pillows at night in order to breathe  Follow cardiac low salt diet and 1.5 lit/day fluid restriction.  If you have diabetes Accuchecks 4 times/day, Once in AM empty stomach and then before each meal. Log in all results and show them to your primary doctor at your next visit. If any glucose reading is under 80 or above 300 call your primary Lori Park immediately.  If you have Seizure/Convulsions/Epilepsy: Please do not drive, operate heavy machinery, participate in activities at heights or participate in high speed sports until you have seen by Primary Lori Park or a Neurologist and advised to do so again.  If you had Gastrointestinal Bleeding: Please ask your Primary Lori Park to check a complete blood count within one week of discharge or at your next visit.  Your endoscopic/colonoscopic biopsies that are pending at the time of discharge, will also need to followed by your Primary Lori Park.  Get Medicines reviewed and adjusted. Please take all your medications with you for your next visit with your Primary Lori Park  Please request your Primary Lori Park to go over all hospital tests and procedure/radiological results at the follow up, please ask your Primary Lori Park to get all Hospital records sent to his/her office.  If you experience worsening of your admission symptoms, develop shortness of breath, life threatening emergency, suicidal or homicidal thoughts you must seek medical attention immediately by calling 911 or calling your Lori Park immediately  if symptoms less severe.  You must read complete instructions/literature along with all the possible adverse reactions/side effects for all the Medicines you take and that have been prescribed to you. Take any new Medicines after you have completely understood and accpet all the possible adverse reactions/side effects.   Do not drive or operate heavy machinery when taking Pain medications.   Do not take more than prescribed Pain, Sleep and Anxiety Medications  Special Instructions: If you have smoked or chewed Tobacco  in the last 2 yrs please stop smoking, stop any regular Alcohol  and or any Recreational drug use.  Wear Seat belts while driving.  Please note You were cared for by a hospitalist during your hospital stay. If you have any questions about your discharge medications or the care you received while you  were in the hospital after you are discharged, you can call the unit and asked to speak with the hospitalist on call if the hospitalist that took care of you is not available. Once you are discharged, your primary care physician will handle any further medical issues. Please note that NO REFILLS for any discharge medications will be authorized once you are discharged, as it is imperative that you return to your primary  care physician (or establish a relationship with a primary care physician if you do not have one) for your aftercare needs so that they can reassess your need for medications and monitor your lab values.  You can reach the hospitalist office at phone [phone] or fax [phone]   If you do not have a primary care physician, you can call [phone] for a physician referral.  Activity: As tolerated with Full fall precautions use walker/cane & assistance as needed  Diet: regular  Disposition Home

## 2014-04-01 DIAGNOSIS — Z510 Encounter for antineoplastic radiation therapy: Secondary | ICD-10-CM | POA: Diagnosis not present

## 2014-04-01 DIAGNOSIS — C7931 Secondary malignant neoplasm of brain: Secondary | ICD-10-CM | POA: Diagnosis not present

## 2014-04-01 DIAGNOSIS — Z87891 Personal history of nicotine dependence: Secondary | ICD-10-CM | POA: Diagnosis not present

## 2014-04-01 DIAGNOSIS — C801 Malignant (primary) neoplasm, unspecified: Secondary | ICD-10-CM | POA: Diagnosis not present

## 2014-04-01 DIAGNOSIS — R911 Solitary pulmonary nodule: Secondary | ICD-10-CM | POA: Diagnosis not present

## 2014-04-01 LAB — CULTURE, BAL-QUANTITATIVE W GRAM STAIN

## 2014-04-01 LAB — CULTURE, BAL-QUANTITATIVE: Colony Count: 10000

## 2014-04-01 NOTE — Progress Notes (Signed)
Radiation Oncology         [id number]) [phone] ________________________________  Initial Outpatient Consultation - Date: 03/31/2014   Name: Allani Reber MRN: [id number]   DOB: 1945-10-01  REFERRING PHYSICIAN: Verlee Monte, MD  DIAGNOSIS:    ICD-9-CM ICD-10-CM   1. 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 female  Who presented to urgent care with complaints of "something's wrong" and imbalance. She was sent to the ER for further work up. A head CT showed masses in the brain with associated edema consistent with metastases. A MRI was performed which showed multiple masses including a large mass in the brainstem with surrounding edema but no midline shift. She was admitted and placed on decadron. A CT of the chest was performed which showed a large right lower lobe mass measuring 4.8 x 4.5 cm with a right hilar lymph node. Ground glass lesion were also noted bilaterally.l No evidence of extrathoracic metastatic disease was noted. She does have a 30 pack year smoking history but quit 9 years ago. She has no travel history and no risk factors for HIV (immunosuppression). She underwent a bronchoscopy this morning and the preliminary read is suspicious cells for adenocarcinoma but further stains are pending. Per pathology there is not enough tissue to send for Foundation 1 testing. She has no history of cancer or radiation prior to this. She has seen Dr. Benay Spice. She was discharged this morning and sent to see me in follow up. She is accompanied by her niece, daughter and husband. She has difficulty with word finding during our conversation but was alert and able to give her own history. Her husband reports no other symptoms (headache, seizures. Weakness, loss of consciousness) other than forgetfulness.   PREVIOUS RADIATION THERAPY: No  FAMILY HISTORY: No family history on file.  SOCIAL HISTORY:  History  Substance Use  Topics  . Smoking status: Former Smoker -- 1.00 packs/day for 30 years    Types: Cigarettes    Quit date: 03/29/2004  . Smokeless tobacco: Not on file  . Alcohol Use: Yes    REVIEW OF SYSTEMS:  A 15 point review of systems is documented in the electronic medical record. This was obtained by the nursing staff. However, I reviewed this with the patient to discuss relevant findings and make appropriate changes.  Pertinent positives are included in the chart.   PHYSICAL EXAM:  Filed Vitals:   03/31/14 1340  BP: 143/97  Pulse: 89  Temp: 97.7 F (36.5 C)  Resp: 20  .170 lb 12.8 oz (77.474 kg). Pleasant female in no distress. CN 2 -12 are intact. Fails finger to nose testing. 5/5 strength bilaterally in upper and lower extremities.   IMPRESSION: Likely metastatic lung cancer to brain.   PLAN:  We discussed that there is a 5-8% chance these lesions could be something other than cancer and the only way to definitvely find out was to do a brain biopsy. Given the lesion in her lung and her smoking history, I think it is likely these are metastses. We discussed the danger in waiting for another biopsy given this large lesion in the brainstem. We discussed the prognosis of non small cell lung cancer with brain metastases. We discussed the process of simulation and making of a mask. We discussed 12 treatments as an outpatient with the most common side effects are skin irritation, hair loss, fatigue, and esophagitis. We discussed the possibility  of brain swelling causing death again due to this brain stem metastases. We discussed continuing the decadron for now. We discussed briefly the possible need for another biopsy for further tissue.  She and her family were tearful but supportive of each other. She  signed informed consent and agreed to procced forward. We were able to schedule her for simulation later today. We will begin treatments on Monday. She also has a follow up with Dr. Benay Spice on Monday and  hopefully final pathology will be back by then and we will know for sure whether another biopsy is needed.  I will also refer to social work for supporting the family.   I spent 60 minutes  face to face with the patient and more than 50% of that time was spent in counseling and/or coordination of care.   ------------------------------------------------  Thea Silversmith, MD

## 2014-04-03 ENCOUNTER — Ambulatory Visit
Admission: RE | Admit: 2014-04-03 | Discharge: 2014-04-03 | Disposition: A | Discharge status: Home / Self Care | Payer: PPO | Source: Ambulatory Visit | Attending: Radiation Oncology | Admitting: Radiation Oncology

## 2014-04-04 ENCOUNTER — Encounter: Payer: Self-pay | Admitting: Oncology

## 2014-04-04 ENCOUNTER — Ambulatory Visit (HOSPITAL_BASED_OUTPATIENT_CLINIC_OR_DEPARTMENT_OTHER): Payer: PPO | Admitting: Oncology

## 2014-04-04 ENCOUNTER — Encounter: Payer: Self-pay | Admitting: *Deleted

## 2014-04-04 ENCOUNTER — Telehealth: Payer: Self-pay | Admitting: Oncology

## 2014-04-04 ENCOUNTER — Ambulatory Visit
Admission: RE | Admit: 2014-04-04 | Discharge: 2014-04-04 | Disposition: A | Discharge status: Home / Self Care | Payer: PPO | Source: Ambulatory Visit | Attending: Radiation Oncology | Admitting: Radiation Oncology

## 2014-04-04 ENCOUNTER — Ambulatory Visit: Payer: PPO

## 2014-04-04 VITALS — BP 164/76 | HR 76 | Temp 98.0°F | Resp 20 | Ht 69.0 in | Wt 166.4 lb

## 2014-04-04 DIAGNOSIS — Z510 Encounter for antineoplastic radiation therapy: Secondary | ICD-10-CM | POA: Diagnosis not present

## 2014-04-04 DIAGNOSIS — R262 Difficulty in walking, not elsewhere classified: Secondary | ICD-10-CM

## 2014-04-04 DIAGNOSIS — R918 Other nonspecific abnormal finding of lung field: Secondary | ICD-10-CM

## 2014-04-04 DIAGNOSIS — C7931 Secondary malignant neoplasm of brain: Secondary | ICD-10-CM

## 2014-04-04 NOTE — [facility] Oncology Navigator Note (Unsigned)
Per Dr. Gearldine Shown request, I in-basket Harrison Mons from [facility] to set up for biopsy.

## 2014-04-04 NOTE — Progress Notes (Signed)
Checked in new pt with no financial concerns prior to seeing the dr. Informed pt if chemo is part of her treatment Raquel will obtain auth from her insurance company as well as contact foundations that offer copay assistance for chemo if needed. Pt has Raquel's card for any billing questions or concerns.

## 2014-04-04 NOTE — Telephone Encounter (Signed)
gv adn printed appt sched anda vs for pt for Feb °

## 2014-04-04 NOTE — Progress Notes (Signed)
  [facility] OFFICE PROGRESS NOTE   Diagnosis: Brain metastases, lung mass  INTERVAL HISTORY:   I saw Ms. Ackroyd the hospital last week after she presented with symptomatic brain metastases. She underwent a bronchoscopy 03/30/2014 after a right lung mass was noted on CT. Brushings, bronchial lavage, and transbronchial biopsies were obtained from the right lower lobe superior segment.  The cytology from bronchial brushings revealed atypical cells suspicious for malignancy. No cellblock was present. The right lower lobe biopsy returned negative for malignancy.  She was placed on Decadron and has been improvement in her balance. She continues to have memory and word finding difficulty. She saw Dr. Pablo Ledger and began whole brain radiation today.  Objective:  Vital signs in last 24 hours:  Blood pressure 164/76, pulse 76, temperature 98 F (36.7 C), temperature source Oral, resp. rate 20, height 5\' 9"  (1.753 m), weight 166 lb 6.4 oz (75.479 kg), SpO2 98 %.    HEENT: Mild whitecoat at the anterior tongue, no buccal thrush Resp: Lungs clear bilaterally with a slight decrease in breath sounds throughout the right chest Cardio: Regular rate and rhythm GI: No hepatomegaly Vascular: No leg edema Neuro: Alert, the motor exam appears intact in the upper and lower extremities     Lab Results:  Lab Results  Component Value Date   WBC 7.3 03/30/2014   HGB 13.0 03/30/2014   HCT 39.0 03/30/2014   MCV 101.6* 03/30/2014   PLT 359 03/30/2014   NEUTROABS 5.8 03/30/2014   Medications: I have reviewed the patient's current medications.  Assessment/Plan:  1. Right lower lung mass with possible lung metastases on a chest CT 03/29/2014  Bronchoscopy 03/30/2014-nondiagnostic tissue obtained 2. Multiple brain metastases with vasogenic edema-palliative radiation started 04/04/2014 3. Gait ataxia and memory loss secondary to #2 4. Hyperlipidemia   Disposition:  She  underwent a bronchoscopy 03/30/2014. The cytology and transbronchial biopsy were nondiagnostic. She was referred to the thoracic oncology navigator and will be scheduled for an appointment with thoracic surgery to consider a repeat bronchoscopy/ EBUS in order to obtain a tissue diagnosis.  She will continue brain radiation and a Decadron taper per Dr. Pablo Ledger.  I discussed the differential diagnosis with Ms. Chavira and her family. We will recommend systemic therapy based on the surgical pathology result.  She will return for an office visit to 04/15/2014.  Betsy Coder, MD  04/04/2014  4:23 PM

## 2014-04-05 ENCOUNTER — Ambulatory Visit
Admission: RE | Admit: 2014-04-05 | Discharge: 2014-04-05 | Disposition: A | Discharge status: Home / Self Care | Payer: PPO | Source: Ambulatory Visit | Attending: Radiation Oncology | Admitting: Radiation Oncology

## 2014-04-05 ENCOUNTER — Ambulatory Visit (INDEPENDENT_AMBULATORY_CARE_PROVIDER_SITE_OTHER): Payer: PPO | Admitting: Cardiothoracic Surgery

## 2014-04-05 ENCOUNTER — Encounter: Payer: Self-pay | Admitting: *Deleted

## 2014-04-05 ENCOUNTER — Other Ambulatory Visit: Payer: Self-pay | Admitting: *Deleted

## 2014-04-05 ENCOUNTER — Telehealth: Payer: Self-pay | Admitting: *Deleted

## 2014-04-05 ENCOUNTER — Encounter: Payer: Self-pay | Admitting: Cardiothoracic Surgery

## 2014-04-05 VITALS — BP 148/92 | HR 89 | Resp 16 | Ht 66.0 in | Wt 166.0 lb

## 2014-04-05 DIAGNOSIS — R918 Other nonspecific abnormal finding of lung field: Secondary | ICD-10-CM

## 2014-04-05 DIAGNOSIS — R590 Localized enlarged lymph nodes: Secondary | ICD-10-CM

## 2014-04-05 DIAGNOSIS — D496 Neoplasm of unspecified behavior of brain: Secondary | ICD-10-CM

## 2014-04-05 DIAGNOSIS — Z510 Encounter for antineoplastic radiation therapy: Secondary | ICD-10-CM | POA: Diagnosis not present

## 2014-04-05 DIAGNOSIS — R911 Solitary pulmonary nodule: Secondary | ICD-10-CM

## 2014-04-05 DIAGNOSIS — R599 Enlarged lymph nodes, unspecified: Secondary | ICD-10-CM

## 2014-04-05 DIAGNOSIS — J984 Other disorders of lung: Secondary | ICD-10-CM

## 2014-04-05 NOTE — Telephone Encounter (Signed)
Called patient to give her appt with Dr. Servando Snare.  Husband, Herbie [place] answered phone.  I gave him time and place of appt.  He verbalized understanding of appt

## 2014-04-05 NOTE — Progress Notes (Signed)
[place] Work  Clinical Social Work was referred by Pension scheme manager for assessment of psychosocial needs.  Clinical Social Worker met with patient and family in exam room at [facility] to offer support and assess for needs.  CSW, patient, and family discussed the importance of support and common emotional feelings and responses to a cancer diagnosis.  CSW informed patient and family of the support team and support services at [facility].  CSW provided support and imphasised the available support for caregivers as well and the patient.  CSw provided contact information and encouraged patient and/or family to call with any needs or concerns.          Clinical Social Work interventions: Energy manager and resource education   Johnnye Lana, MSW, LCSW, [place] Clinical Social Worker [place] [phone]

## 2014-04-05 NOTE — Progress Notes (Signed)
[address]       [employer],[place] [address]             [phone]                    [place] [place] Medical Record #[id number] Date of Birth: 08-07-1945  Referring: Shirline Frees, MD Primary Care: Shirline Frees, MD  Chief Complaint:    Chief Complaint  Patient presents with  . Lung Lesion    multiple  . Brain Tumor    treated with radiation  . Adenopathy    HILAR  . Lung Mass    RLL.Marland KitchenMarland KitchenNEEDS TISSUE DX    History of Present Illness:    Lori Park 69 y.o. female is seen in the office  today for right lung mass and brain metastases. The patient presented last week to urgent care because of neurologic changes involving primarily her right side and also some difficulty with speech. She admitted to [facility] CT of the head was performed, CT of the chest was performed and ultimately a bronchoscopy but without a tissue diagnosis. Patient is referred to thoracic surgery in need of a tissue diagnosis.    Current Activity/ Functional Status:  Patient was independent with mobility/ambulation, transfers, ADL's, IADL's.   Zubrod Score: At the time of surgery this patient's most appropriate activity status/level should be described as: []     0    Normal activity, no symptoms []     1    Restricted in physical strenuous activity but ambulatory, able to do out light work []     2    Ambulatory and capable of self care, unable to do work activities, up and about               >50 % of waking hours                              []     3    Only limited self care, in bed greater than 50% of waking hours []     4    Completely disabled, no self care, confined to bed or chair []     5    Moribund   Past Medical History  Diagnosis Date  . Hypertension   . High cholesterol     Past Surgical History  Procedure Laterality Date  . Video bronchoscopy Bilateral 03/30/2014    Procedure: VIDEO BRONCHOSCOPY WITH FLUORO;  Surgeon: Rush Farmer, MD;   Location: [facility];  Service: Cardiopulmonary;  Laterality: Bilateral;    No family history on file.  History   Social History  . Marital Status: Married    Spouse Name: N/A    Number of Children: 1  . Years of Education: N/A   Occupational History  . Not on file.   Social History Main Topics  . Smoking status: Former Smoker -- 1.00 packs/day for 30 years    Types: Cigarettes    Quit date: 03/29/2004  . Smokeless tobacco: Not on file  . Alcohol Use: Yes  . Drug Use: No  . Sexual Activity: Not on file   Other Topics Concern  . Not on file   Social History Narrative    History  Smoking status  . Former Smoker -- 1.00 packs/day for 30 years  . Types: Cigarettes  . Quit date: 03/29/2004  Smokeless tobacco  . Not on file  History  Alcohol Use  . Yes     Allergies  Allergen Reactions  . Nickel Rash    Current Outpatient Prescriptions  Medication Sig Dispense Refill  . aspirin EC 81 MG tablet Take 81 mg by mouth at bedtime.    . Calcium Carb-Cholecalciferol (CALCIUM-VITAMIN D) 600-400 MG-UNIT TABS Take 600 mg by mouth 2 (two) times daily.     . Cholecalciferol (VITAMIN D3) 2000 UNITS TABS Take 2,000 Units by mouth daily.    . citalopram (CELEXA) 20 MG tablet Take 20 mg by mouth daily.    Marland Kitchen dexamethasone (DECADRON) 4 MG tablet Take 2 tablets (8 mg total) by mouth 2 (two) times daily. For 7 days then 1 tablet (4 mg) twice daily. 60 tablet 0  . lisinopril (PRINIVIL,ZESTRIL) 10 MG tablet Take 1 tablet (10 mg total) by mouth daily. 30 tablet 0  . niacin (NIASPAN) 1000 MG CR tablet Take 1,000 mg by mouth at bedtime.    . OMEGA-3 FATTY ACIDS PO Take 2,000 mg by mouth 2 (two) times daily.    . Polyvinyl Alcohol-Povidone (REFRESH OP) Place 2 drops into both eyes daily as needed (dry eyes).    . simvastatin (ZOCOR) 20 MG tablet Take 20 mg by mouth daily.  1  . vitamin E 400 UNIT capsule Take 400 Units by mouth daily.     No current facility-administered  medications for this visit.      Review of Systems:     Cardiac Review of Systems: Y or N  Chest Pain [ n   ]  Resting SOB [ n  ] Exertional SOB  Blue.Reese  ]  Orthopnea [ n ]   Pedal Edema [ n  ]    Palpitations [ n ] Syncope  [n ]   Presyncope [n   ]  General Review of Systems: [Y] = yes [  ]=no Constitional: recent weight change Blue.Reese  ];  Wt loss over the last 3 months [   ] anorexia [  ]; fatigue [ y ]; nausea [  ]; night sweats [  ]; fever [  ]; or chills [  ];          Dental: poor dentition[  ]; Last Dentist visit:   Eye : blurred vision [  ]; diplopia [   ]; vision changes [  ];  Amaurosis fugax[  ]; Resp: cough [  ];  wheezing[  ];  hemoptysis[  ]; shortness of breath[  ]; paroxysmal nocturnal dyspnea[  ]; dyspnea on exertion[  ]; or orthopnea[  ];  GI:  gallstones[  ], vomiting[  ];  dysphagia[  ]; melena[  ];  hematochezia [  ]; heartburn[  ];   Hx of  Colonoscopy[  ]; GU: kidney stones [  ]; hematuria[  ];   dysuria [  ];  nocturia[  ];  history of     obstruction [  ]; urinary frequency [  ]             Skin: rash, swelling[  ];, hair loss[  ];  peripheral edema[  ];  or itching[  ]; Musculosketetal: myalgias[  ];  joint swelling[  ];  joint erythema[  ];  joint pain[  ];  back pain[  ];  Heme/Lymph: bruising[  ];  bleeding[  ];  anemia[  ];  Neuro: TIA[  ];  headaches[y  ];  stroke[  ];  vertigo[  ];  seizures[  ];  paresthesias[  ];  difficulty walking[y  ];  Psych:depression[  ]; anxiety[  ];  Endocrine: diabetes[  ];  thyroid dysfunction[  ];  Immunizations: Flu up to date [  ]; Pneumococcal up to date [  ];  Other:  Physical Exam: BP 148/92 mmHg  Pulse 89  Resp 16  Ht 5\' 6"  (1.676 m)  Wt 166 lb (75.297 kg)  BMI 26.81 kg/m2  SpO2 98%  PHYSICAL EXAMINATION: General appearance: alert and cooperative Head: Normocephalic, without obvious abnormality, atraumatic Neck: no adenopathy, no carotid bruit, no JVD, supple, symmetrical, trachea midline and thyroid not enlarged,  symmetric, no tenderness/mass/nodules Lymph nodes: Cervical, supraclavicular, and axillary nodes normal. Resp: clear to auscultation bilaterally Back: symmetric, no curvature. ROM normal. No CVA tenderness. Cardio: regular rate and rhythm, S1, S2 normal, no murmur, click, rub or gallop GI: soft, non-tender; bowel sounds normal; no masses,  no organomegaly Extremities: extremities normal, atraumatic, no cyanosis or edema and Homans sign is negative, no sign of DVT Neurologic: Alert and oriented X 3, normal strength and tone. Normal symmetric reflexes. Normal coordination and gait Patient noted previously she had some weakness especially in the right leg, she notes his symptoms have improved since she starting on steroids She has very slight difficulty with words Diagnostic Studies & Laboratory data:     Recent Radiology Findings:   Ct Head Wo Contrast  03/28/2014   CLINICAL DATA:  Dizziness for 4-5 days, forgetfulness. History of hypertension and hyperlipidemia.  EXAM: CT HEAD WITHOUT CONTRAST  TECHNIQUE: Contiguous axial images were obtained from the base of the skull through the vertex without intravenous contrast.  COMPARISON:  None.  FINDINGS: Expansile vasogenic edema in LEFT upper lobe with mild mass effect on the LEFT lateral ventricle, no hydrocephalus. In addition, LEFT posterior frontal lobe encephalomalacia. Additional expansile hypodensities in the pons, RIGHT brachium pontis. Mild expansile hypodensity in RIGHT parietal lobe. No intraparenchymal hemorrhage. No acute large vascular territory infarct.  No abnormal extra-axial fluid collections. Basal cisterns are patent. Mild calcific atherosclerosis of the carotid siphons.  No skull fracture. The included ocular globes and orbital contents are non-suspicious. The mastoid aircells and included paranasal sinuses are well-aerated.  IMPRESSION: Multiple expansile hypodensities in the supra and infratentorial brain, largest in LEFT frontal lobe  inferring underlying masses, concerning for metastatic disease (less likely infection) and would be better characterized on MRI of the brain with contrast.  Acute findings discussed with and reconfirmed by Dr.MELANIE BELFI on 03/28/2014 at 9:40 pm.   Electronically Signed   By: Elon Alas   On: 03/28/2014 21:41   Ct Chest W Contrast  03/29/2014   CLINICAL DATA:  Brain metastases.  Evaluate for primary source.  EXAM: CT CHEST, ABDOMEN, AND PELVIS WITH CONTRAST  TECHNIQUE: Multidetector CT imaging of the chest, abdomen and pelvis was performed following the standard protocol during bolus administration of intravenous contrast.  CONTRAST:  154mL OMNIPAQUE IOHEXOL 300 MG/ML  SOLN  COMPARISON:  CT chest 09/08/2007  FINDINGS: CT CHEST FINDINGS  Normal heart size. Normal caliber thoracic aorta. Great vessel origins are patent. Calcification of the aorta and Coronary arteries. Esophagus is decompressed.  Mass in the superior segment of the right lower lung with air bronchograms. The mass lesion measures about 4.8 x 4.5 cm. Prominent right hilar lymph node measuring 12 mm is probably metastatic. This likely represents primary lung cancer. Dominant metastasis not entirely excluded. There are vague nodular ground-glass lesions demonstrated both lungs, measuring less than 1 cm diameter. These  are likely metastatic. Focal areas scarring in the right middle lung is unchanged since prior study. No pneumothorax. No pleural effusions. Airways appear patent.  CT ABDOMEN AND PELVIS FINDINGS  Diffuse fatty infiltration of the liver. No focal liver lesions appreciated. The gallbladder, pancreas, spleen, adrenal glands, kidneys, inferior vena cava, and retroperitoneal lymph nodes are unremarkable. Calcification of the abdominal aorta without aneurysm. Small accessory spleen. Stomach, small bowel, and colon appear normal for degree of distention. Contrast material flows through to the colon without evidence of bowel obstruction.  No free air or free fluid in the abdomen.  Pelvis: Diverticulosis of the sigmoid colon without evidence of diverticulitis. Bladder wall is not thickened. Uterus and ovaries are not enlarged. Appendix is normal. No free or loculated pelvic fluid collections. No pelvic mass or lymphadenopathy. Bones: Degenerative changes in the thoracic and lumbar spine with normal alignment. No destructive bone lesions are identified.  IMPRESSION: 4.8 cm mass in the superior segment right lower lung likely represents primary neoplasm. Additional small nodular ground-glass lesions in both lungs may represent metastasis. Probable right hilar lymph node metastasis.  Diffuse fatty infiltration of the liver. No evidence of metastatic disease in the abdomen or pelvis.   Electronically Signed   By: Lucienne Capers M.D.   On: 03/29/2014 02:04   Mr Jeri Cos GL Contrast  03/28/2014   CLINICAL DATA:  Four-day history of dizziness, lightheadedness, speech difficulties. Gait instability.  EXAM: MRI HEAD WITHOUT AND WITH CONTRAST  TECHNIQUE: Multiplanar, multiecho pulse sequences of the brain and surrounding structures were obtained without and with intravenous contrast.  CONTRAST:  73mL MULTIHANCE GADOBENATE DIMEGLUMINE 529 MG/ML IV SOLN  COMPARISON:  CT of the head March 28, 2014 at 2116 hr  FINDINGS: No reduced diffusion to suggest acute ischemia, hypercellular large tumor or purulent material. No susceptibility artifact to suggest hemorrhage.  At least 4 infratentorially greater than 14 supratentorial intermediate FLAIR signal, T2 hyperintense peripherally enhancing cystic metastasis seen at the gray-white matter junction. Surrounding T2 bright vasogenic edema, most apparent within the LEFT frontal lobe with sulcal effacement, no midline shift. Mild T2 bright vasogenic edema within the pons partially effaces the pre pontine cistern, with mild mass effect on the basilar artery. Mild white matter changes of exclusive of the aforementioned  bowel abnormality most consistent chronic small vessel ischemic disease.  Small remote RIGHT cerebellar infarcts. No abnormal extra-axial fluid collections. No suspicious dural, leptomeningeal enhancement. No extra-axial masses. Major intracranial vascular flow voids are preserved at the skullbase.  No paranasal sinus air-fluid levels. Trace fluid signal in the RIGHT mastoid air cells.  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 Surrounding vasogenic edema, with pontine edema resulting in mild pre pontine cistern effacement. No midline shift.  Remote small RIGHT cerebellar infarcts.   Electronically Signed   By: Elon Alas   On: 03/28/2014 23:42   Ct Abdomen Pelvis W Contrast  03/29/2014   CLINICAL DATA:  Brain metastases.  Evaluate for primary source.  EXAM: CT CHEST, ABDOMEN, AND PELVIS WITH CONTRAST  TECHNIQUE: Multidetector CT imaging of the chest, abdomen and pelvis was performed following the standard protocol during bolus administration of intravenous contrast.  CONTRAST:  149mL OMNIPAQUE IOHEXOL 300 MG/ML  SOLN  COMPARISON:  CT chest 09/08/2007  FINDINGS: CT CHEST FINDINGS  Normal heart size. Normal caliber thoracic aorta. Great vessel origins are patent. Calcification of the aorta and Coronary arteries. Esophagus is decompressed.  Mass in the superior segment of the right  lower lung with air bronchograms. The mass lesion measures about 4.8 x 4.5 cm. Prominent right hilar lymph node measuring 12 mm is probably metastatic. This likely represents primary lung cancer. Dominant metastasis not entirely excluded. There are vague nodular ground-glass lesions demonstrated both lungs, measuring less than 1 cm diameter. These are likely metastatic. Focal areas scarring in the right middle lung is unchanged since prior study. No pneumothorax. No pleural effusions. Airways appear patent.  CT ABDOMEN AND PELVIS FINDINGS  Diffuse fatty infiltration of the liver.  No focal liver lesions appreciated. The gallbladder, pancreas, spleen, adrenal glands, kidneys, inferior vena cava, and retroperitoneal lymph nodes are unremarkable. Calcification of the abdominal aorta without aneurysm. Small accessory spleen. Stomach, small bowel, and colon appear normal for degree of distention. Contrast material flows through to the colon without evidence of bowel obstruction. No free air or free fluid in the abdomen.  Pelvis: Diverticulosis of the sigmoid colon without evidence of diverticulitis. Bladder wall is not thickened. Uterus and ovaries are not enlarged. Appendix is normal. No free or loculated pelvic fluid collections. No pelvic mass or lymphadenopathy. Bones: Degenerative changes in the thoracic and lumbar spine with normal alignment. No destructive bone lesions are identified.  IMPRESSION: 4.8 cm mass in the superior segment right lower lung likely represents primary neoplasm. Additional small nodular ground-glass lesions in both lungs may represent metastasis. Probable right hilar lymph node metastasis.  Diffuse fatty infiltration of the liver. No evidence of metastatic disease in the abdomen or pelvis.   Electronically Signed   By: Lucienne Capers M.D.   On: 03/29/2014 02:04   Dg C-arm Bronchoscopy  03/30/2014   CLINICAL DATA:    C-ARM BRONCHOSCOPY  Fluoroscopy was utilized by the requesting physician.  No radiographic  interpretation.      I have independently reviewed the above radiologic studies.  Recent Lab Findings: Lab Results  Component Value Date   WBC 7.3 03/30/2014   HGB 13.0 03/30/2014   HCT 39.0 03/30/2014   PLT 359 03/30/2014   GLUCOSE 152* 03/30/2014   ALT 23 03/30/2014   AST 27 03/30/2014   NA 138 03/30/2014   K 4.5 03/30/2014   CL 105 03/30/2014   CREATININE 0.83 03/30/2014   BUN 11 03/30/2014   CO2 20 03/30/2014   INR 1.01 03/30/2014      Assessment / Plan:   Probable lung cancer, possible adeno clinical stage IV. On reviewing the  scans and the previous attempted tissue diagnosis with bronchoscopy I discussed with the patient and her husband proceeding with CT-guided needle biopsy of the right lung mass, which should with relatively little risk obtain sufficient tissue with a core needle to obtain a tissue diagnosis and have material for genetic testing if appropriate. Patient is willing to proceed and arrangements will be made to proceed as soon as possible. I do not think there is a need to wait until the patient has the PET scan which is been ordered but not scheduled.      I  spent 30 minutes counseling the patient face to face and 50% or more the  time was spent in counseling and coordination of care. The total time spent in the appointment was 45 minutes.  Grace Isaac MD      [address] [place] [address] Office [phone]   Beeper [phone]  04/05/2014 6:32 PM

## 2014-04-06 ENCOUNTER — Ambulatory Visit
Admission: RE | Admit: 2014-04-06 | Discharge: 2014-04-06 | Disposition: A | Discharge status: Home / Self Care | Payer: PPO | Source: Ambulatory Visit | Attending: Radiation Oncology | Admitting: Radiation Oncology

## 2014-04-06 ENCOUNTER — Encounter: Payer: Self-pay | Admitting: Radiation Oncology

## 2014-04-06 VITALS — BP 153/84 | HR 68 | Temp 97.7°F | Resp 20 | Wt 167.8 lb

## 2014-04-06 DIAGNOSIS — C7931 Secondary malignant neoplasm of brain: Secondary | ICD-10-CM

## 2014-04-06 DIAGNOSIS — Z510 Encounter for antineoplastic radiation therapy: Secondary | ICD-10-CM | POA: Diagnosis not present

## 2014-04-06 MED ORDER — BIAFINE EX EMUL
Freq: Every day | CUTANEOUS | Status: DC
  Administered 2014-04-06: 09:00:00 via TOPICAL

## 2014-04-06 MED ORDER — EMOLLIENT BASE EX CREA: TOPICAL_CREAM | CUTANEOUS | Status: DC | PRN

## 2014-04-06 NOTE — Progress Notes (Signed)
Weekly rad txs braqin 3/12 compl;eted, no head ache,nausea, no blurred vision , does "get wobbly" when standing , takes her time to get moving, uses tabl if needed to get around ,not using walker as yet, pt education done, rad book and biafine cream given, discussed s/e, teach back given, skin irritation,fatigue, nausea, blurred vision, pain, , no pain 8:58 AM

## 2014-04-06 NOTE — Progress Notes (Signed)
Weekly Management Note Current Dose: 7.5 Gy  Projected Dose:30  Gy   Narrative:  The patient presents for routine under treatment assessment.  CBCT/MVCT images/Port film x-rays were reviewed.  The chart was checked. Doing well. No complaints. On 8 mg of decadron bid with no symptoms of headache or focal weakness. CT guided biopsy and PET being scheduled. PT coming to house. Husband supportive.   Physical Findings:  Unchanged Alert and oriented. No thrush.   Vitals:  Filed Vitals:   04/06/14 0846  BP: 153/84  Pulse: 68  Temp: 97.7 F (36.5 C)  Resp: 20   Weight:  Wt Readings from Last 3 Encounters:  04/05/14 166 lb (75.297 kg)  04/04/14 166 lb 6.4 oz (75.479 kg)  03/31/14 170 lb 12.8 oz (77.474 kg)   Lab Results  Component Value Date   WBC 7.3 03/30/2014   HGB 13.0 03/30/2014   HCT 39.0 03/30/2014   MCV 101.6* 03/30/2014   PLT 359 03/30/2014   Lab Results  Component Value Date   CREATININE 0.83 03/30/2014   BUN 11 03/30/2014   NA 138 03/30/2014   K 4.5 03/30/2014   CL 105 03/30/2014   CO2 20 03/30/2014     Impression:  The patient is tolerating radiation.  Plan:  Continue treatment as planned. Discussed taper of decadron 4 mg per week starting this week.

## 2014-04-07 ENCOUNTER — Ambulatory Visit
Admission: RE | Admit: 2014-04-07 | Discharge: 2014-04-07 | Disposition: A | Discharge status: Home / Self Care | Payer: PPO | Source: Ambulatory Visit | Attending: Radiation Oncology | Admitting: Radiation Oncology

## 2014-04-07 ENCOUNTER — Encounter: Payer: Self-pay | Admitting: *Deleted

## 2014-04-07 DIAGNOSIS — Z510 Encounter for antineoplastic radiation therapy: Secondary | ICD-10-CM | POA: Diagnosis not present

## 2014-04-07 NOTE — [facility] Oncology Navigator Note (Unsigned)
Saw Lori Park before her radiation appt.  She stated she was felling ok.  I listened as she explained.  No barriers noted at this time.

## 2014-04-08 ENCOUNTER — Telehealth: Payer: Self-pay | Admitting: Oncology

## 2014-04-08 ENCOUNTER — Ambulatory Visit
Admission: RE | Admit: 2014-04-08 | Discharge: 2014-04-08 | Disposition: A | Discharge status: Home / Self Care | Payer: PPO | Source: Ambulatory Visit | Attending: Radiation Oncology | Admitting: Radiation Oncology

## 2014-04-08 DIAGNOSIS — Z510 Encounter for antineoplastic radiation therapy: Secondary | ICD-10-CM | POA: Diagnosis not present

## 2014-04-08 NOTE — Telephone Encounter (Signed)
pt came in to r/s appt due to bx on 2.17.Marland KitchenMarland KitchenMarland Kitchenpt ok and aware of new time

## 2014-04-11 ENCOUNTER — Ambulatory Visit
Admission: RE | Admit: 2014-04-11 | Discharge: 2014-04-11 | Disposition: A | Discharge status: Home / Self Care | Payer: PPO | Source: Ambulatory Visit | Attending: Radiation Oncology | Admitting: Radiation Oncology

## 2014-04-11 ENCOUNTER — Telehealth: Payer: Self-pay | Admitting: *Deleted

## 2014-04-11 DIAGNOSIS — Z510 Encounter for antineoplastic radiation therapy: Secondary | ICD-10-CM | POA: Diagnosis not present

## 2014-04-11 NOTE — Telephone Encounter (Signed)
Patient's husband called left me a vm message to call.  I did and left my phone number to call back.

## 2014-04-12 ENCOUNTER — Ambulatory Visit
Admission: RE | Admit: 2014-04-12 | Discharge: 2014-04-12 | Disposition: A | Discharge status: Home / Self Care | Payer: PPO | Source: Ambulatory Visit | Attending: Radiation Oncology | Admitting: Radiation Oncology

## 2014-04-12 VITALS — BP 124/74 | HR 71 | Temp 97.6°F | Wt 165.4 lb

## 2014-04-12 DIAGNOSIS — C7931 Secondary malignant neoplasm of brain: Secondary | ICD-10-CM

## 2014-04-12 DIAGNOSIS — Z510 Encounter for antineoplastic radiation therapy: Secondary | ICD-10-CM | POA: Diagnosis not present

## 2014-04-12 MED ORDER — DEXAMETHASONE 4 MG PO TABS: ORAL_TABLET | ORAL | Status: DC

## 2014-04-12 NOTE — Progress Notes (Signed)
Weekly assessment of radiation to brain.Completed 7 of 12 treatments.Denies pain, headache or nausea.Reviewed prep for PET scheduled for 04/15/14.Continue dexamethasone taper.To start 1 pill bid tomorrow and then go to 1 daily on 04/20/14.Patient will run out end of next week.Scheduled for needle biopsy on 04/20/14 and to discuss results with Dr.Sherill on 04/27/14.No side effects from treatment.

## 2014-04-12 NOTE — Progress Notes (Signed)
Weekly Management Note Current Dose:17.5 Gy  Projected Dose: 30 Gy   Narrative:  The patient presents for routine under treatment assessment.  CBCT/MVCT images/Port film x-rays were reviewed.  The chart was checked.Doing well. No headaches. Some LE weakness when walking. Decadron taper going we.. Needs refill. PET and biopsy scheduled for next week.  Physical Findings:  Unchanged. No oral thrush.   Vitals:  Filed Vitals:   04/12/14 1017  BP: 124/74  Pulse: 71  Temp: 97.6 F (36.4 C)   Weight:  Wt Readings from Last 3 Encounters:  04/12/14 165 lb 6.4 oz (75.025 kg)  04/05/14 166 lb (75.297 kg)  04/04/14 166 lb 6.4 oz (75.479 kg)   Lab Results  Component Value Date   WBC 7.3 03/30/2014   HGB 13.0 03/30/2014   HCT 39.0 03/30/2014   MCV 101.6* 03/30/2014   PLT 359 03/30/2014   Lab Results  Component Value Date   CREATININE 0.83 03/30/2014   BUN 11 03/30/2014   NA 138 03/30/2014   K 4.5 03/30/2014   CL 105 03/30/2014   CO2 20 03/30/2014     Impression:  The patient is tolerating radiation.  Plan:  Continue treatment as planned. Will refill decadron. Discussed coping issues.

## 2014-04-13 ENCOUNTER — Ambulatory Visit
Admission: RE | Admit: 2014-04-13 | Discharge: 2014-04-13 | Disposition: A | Discharge status: Home / Self Care | Payer: PPO | Source: Ambulatory Visit | Attending: Radiation Oncology | Admitting: Radiation Oncology

## 2014-04-13 DIAGNOSIS — Z510 Encounter for antineoplastic radiation therapy: Secondary | ICD-10-CM | POA: Diagnosis not present

## 2014-04-14 ENCOUNTER — Ambulatory Visit
Admission: RE | Admit: 2014-04-14 | Discharge: 2014-04-14 | Disposition: A | Discharge status: Home / Self Care | Payer: PPO | Source: Ambulatory Visit | Attending: Radiation Oncology | Admitting: Radiation Oncology

## 2014-04-14 DIAGNOSIS — Z510 Encounter for antineoplastic radiation therapy: Secondary | ICD-10-CM | POA: Diagnosis not present

## 2014-04-15 ENCOUNTER — Ambulatory Visit: Payer: PPO | Admitting: Nurse Practitioner

## 2014-04-15 ENCOUNTER — Ambulatory Visit (HOSPITAL_COMMUNITY)
Admission: RE | Admit: 2014-04-15 | Discharge: 2014-04-15 | Disposition: A | Discharge status: Home / Self Care | Payer: PPO | Source: Ambulatory Visit | Attending: Oncology | Admitting: Oncology

## 2014-04-15 ENCOUNTER — Encounter (HOSPITAL_COMMUNITY): Payer: Self-pay

## 2014-04-15 ENCOUNTER — Ambulatory Visit
Admission: RE | Admit: 2014-04-15 | Discharge: 2014-04-15 | Disposition: A | Discharge status: Home / Self Care | Payer: PPO | Source: Ambulatory Visit | Attending: Radiation Oncology | Admitting: Radiation Oncology

## 2014-04-15 DIAGNOSIS — Z510 Encounter for antineoplastic radiation therapy: Secondary | ICD-10-CM | POA: Diagnosis not present

## 2014-04-15 DIAGNOSIS — C7931 Secondary malignant neoplasm of brain: Secondary | ICD-10-CM | POA: Insufficient documentation

## 2014-04-15 DIAGNOSIS — Z87891 Personal history of nicotine dependence: Secondary | ICD-10-CM | POA: Insufficient documentation

## 2014-04-15 DIAGNOSIS — C3490 Malignant neoplasm of unspecified part of unspecified bronchus or lung: Secondary | ICD-10-CM | POA: Diagnosis not present

## 2014-04-15 LAB — GLUCOSE, CAPILLARY: Glucose-Capillary: 130 mg/dL — ABNORMAL HIGH (ref 70–99)

## 2014-04-15 MED ORDER — FLUDEOXYGLUCOSE F - 18 (FDG) INJECTION
8.6000 | Freq: Once | INTRAVENOUS | Status: AC | PRN
  Administered 2014-04-15: 8.6 via INTRAVENOUS

## 2014-04-18 ENCOUNTER — Ambulatory Visit
Admission: RE | Admit: 2014-04-18 | Discharge: 2014-04-18 | Disposition: A | Discharge status: Home / Self Care | Payer: PPO | Source: Ambulatory Visit | Attending: Radiation Oncology | Admitting: Radiation Oncology

## 2014-04-18 ENCOUNTER — Other Ambulatory Visit: Payer: Self-pay | Admitting: Radiology

## 2014-04-18 DIAGNOSIS — Z510 Encounter for antineoplastic radiation therapy: Secondary | ICD-10-CM | POA: Diagnosis not present

## 2014-04-19 ENCOUNTER — Other Ambulatory Visit: Payer: Self-pay | Admitting: Radiology

## 2014-04-19 ENCOUNTER — Ambulatory Visit
Admission: RE | Admit: 2014-04-19 | Discharge: 2014-04-19 | Disposition: A | Discharge status: Home / Self Care | Payer: PPO | Source: Ambulatory Visit | Attending: Radiation Oncology | Admitting: Radiation Oncology

## 2014-04-19 ENCOUNTER — Encounter: Payer: Self-pay | Admitting: Radiation Oncology

## 2014-04-19 VITALS — BP 134/86 | HR 73 | Temp 97.3°F | Resp 20 | Wt 164.8 lb

## 2014-04-19 DIAGNOSIS — Z510 Encounter for antineoplastic radiation therapy: Secondary | ICD-10-CM | POA: Diagnosis not present

## 2014-04-19 NOTE — Progress Notes (Signed)
Weekly rad txs brain 12/12 completed, no c/o pain, head aches, nausea, does get off balance when walking  stated, appetite good, stated needs refill on decadron taper "is a few tabs short of last week,taking 1  tab now", pet scan results 04/15/14 ready, scheduled for needle  biopsy 04/20/14

## 2014-04-19 NOTE — Progress Notes (Signed)
  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   Narrative: The patient tolerated radiation treatment relatively well.   She was on a steroid taper and tolerating that with only minor headaches lasting less than 10 minutes. Her balance was stable to slightly worse. She has a biopsy scheduled for tomorrow and follow up with Dr. Benay Spice next week.   Plan: The patient has completed radiation treatment. The patient will return to radiation oncology clinic for routine followup in one month. I advised them to call or return sooner if they have any questions or concerns related to their recovery or treatment.  ------------------------------------------------  Thea Silversmith, MD

## 2014-04-20 ENCOUNTER — Ambulatory Visit (HOSPITAL_COMMUNITY)
Admission: RE | Admit: 2014-04-20 | Discharge: 2014-04-20 | Disposition: A | Discharge status: Home / Self Care | Payer: PPO | Source: Ambulatory Visit | Attending: Radiation Oncology | Admitting: Radiation Oncology

## 2014-04-20 ENCOUNTER — Ambulatory Visit (HOSPITAL_COMMUNITY)
Admission: RE | Admit: 2014-04-20 | Discharge: 2014-04-20 | Disposition: A | Discharge status: Home / Self Care | Payer: PPO | Source: Ambulatory Visit | Attending: Interventional Radiology | Admitting: Interventional Radiology

## 2014-04-20 ENCOUNTER — Ambulatory Visit (HOSPITAL_COMMUNITY)
Admission: RE | Admit: 2014-04-20 | Discharge: 2014-04-20 | Disposition: A | Discharge status: Home / Self Care | Payer: PPO | Source: Ambulatory Visit | Attending: Cardiothoracic Surgery | Admitting: Cardiothoracic Surgery

## 2014-04-20 ENCOUNTER — Encounter (HOSPITAL_COMMUNITY): Payer: Self-pay

## 2014-04-20 DIAGNOSIS — C7931 Secondary malignant neoplasm of brain: Secondary | ICD-10-CM | POA: Insufficient documentation

## 2014-04-20 DIAGNOSIS — E780 Pure hypercholesterolemia: Secondary | ICD-10-CM | POA: Insufficient documentation

## 2014-04-20 DIAGNOSIS — Z87891 Personal history of nicotine dependence: Secondary | ICD-10-CM | POA: Diagnosis not present

## 2014-04-20 DIAGNOSIS — C3431 Malignant neoplasm of lower lobe, right bronchus or lung: Secondary | ICD-10-CM | POA: Diagnosis not present

## 2014-04-20 DIAGNOSIS — R911 Solitary pulmonary nodule: Secondary | ICD-10-CM | POA: Insufficient documentation

## 2014-04-20 DIAGNOSIS — Z7982 Long term (current) use of aspirin: Secondary | ICD-10-CM | POA: Insufficient documentation

## 2014-04-20 DIAGNOSIS — Z9889 Other specified postprocedural states: Secondary | ICD-10-CM

## 2014-04-20 DIAGNOSIS — I10 Essential (primary) hypertension: Secondary | ICD-10-CM | POA: Insufficient documentation

## 2014-04-20 DIAGNOSIS — R918 Other nonspecific abnormal finding of lung field: Secondary | ICD-10-CM | POA: Diagnosis present

## 2014-04-20 LAB — PROTIME-INR
INR: 0.96 (ref 0.00–1.49)
PROTHROMBIN TIME: 12.9 s (ref 11.6–15.2)

## 2014-04-20 LAB — APTT: aPTT: 22 seconds — ABNORMAL LOW (ref 24–37)

## 2014-04-20 LAB — CBC
HCT: 41.2 % (ref 36.0–46.0)
HEMOGLOBIN: 13.8 g/dL (ref 12.0–15.0)
MCH: 33.5 pg (ref 26.0–34.0)
MCHC: 33.5 g/dL (ref 30.0–36.0)
MCV: 100 fL (ref 78.0–100.0)
Platelets: 272 10*3/uL (ref 150–400)
RBC: 4.12 MIL/uL (ref 3.87–5.11)
RDW: 12.9 % (ref 11.5–15.5)
WBC: 7.3 10*3/uL (ref 4.0–10.5)

## 2014-04-20 MED ORDER — LIDOCAINE HCL 1 % IJ SOLN
INTRAMUSCULAR | Status: AC
  Filled 2014-04-20: qty 20

## 2014-04-20 MED ORDER — FENTANYL CITRATE 0.05 MG/ML IJ SOLN
INTRAMUSCULAR | Status: AC
  Filled 2014-04-20: qty 2

## 2014-04-20 MED ORDER — MIDAZOLAM HCL 2 MG/2ML IJ SOLN
INTRAMUSCULAR | Status: AC
  Filled 2014-04-20: qty 2

## 2014-04-20 MED ORDER — MIDAZOLAM HCL 2 MG/2ML IJ SOLN
INTRAMUSCULAR | Status: AC | PRN
  Administered 2014-04-20 (×2): 1 mg via INTRAVENOUS

## 2014-04-20 MED ORDER — SODIUM CHLORIDE 0.9 % IV SOLN
INTRAVENOUS | Status: DC
  Administered 2014-04-20: 09:00:00 via INTRAVENOUS

## 2014-04-20 MED ORDER — FENTANYL CITRATE 0.05 MG/ML IJ SOLN
INTRAMUSCULAR | Status: AC | PRN
  Administered 2014-04-20: 50 ug via INTRAVENOUS

## 2014-04-20 NOTE — H&P (Signed)
Chief Complaint: Rt lung mass Brain metastasis  Referring Physician(s): Gerhardt,Edward B  History of Present Illness: Lori Park is a 69 y.o. female   Pt presented to Urgent Care with dizziness; confusion gait instability Jan 2016 Sent to [facility] for further work up Brain mets Rt lung mass; +PET Bronchoscopy 03/30/2014 - non diagnostic Scheduled for Rt lung mass needle biopsy  Past Medical History  Diagnosis Date  . Hypertension   . High cholesterol     Past Surgical History  Procedure Laterality Date  . Video bronchoscopy Bilateral 03/30/2014    Procedure: VIDEO BRONCHOSCOPY WITH FLUORO;  Surgeon: Rush Farmer, MD;  Location: [facility];  Service: Cardiopulmonary;  Laterality: Bilateral;    Allergies: Nickel  Medications: Prior to Admission medications   Medication Sig Start Date End Date Taking? Authorizing Provider  aspirin EC 81 MG tablet Take 81 mg by mouth at bedtime.   Yes Historical Provider, MD  Calcium Carb-Cholecalciferol (CALCIUM-VITAMIN D) 600-400 MG-UNIT TABS Take 600 mg by mouth 2 (two) times daily.    Yes Historical Provider, MD  Cholecalciferol (VITAMIN D3) 2000 UNITS TABS Take 2,000 Units by mouth at bedtime.    Yes Historical Provider, MD  citalopram (CELEXA) 20 MG tablet Take 20 mg by mouth daily.   Yes Historical Provider, MD  dexamethasone (DECADRON) 4 MG tablet Take po per physician instructions (taper): Take 1 tablet po bid x 1 week then 1 tablet po qd x 1 week, then stop. Patient taking differently: Take 4 mg by mouth See admin instructions. Tapered dose started 04/13/14:  Take 1 tablet (4 mg) twice daily for 7 days, then take 1 tablet (4 mg) daily for 7 days, then stop 04/12/14  Yes Thea Silversmith, MD  ibuprofen (ADVIL,MOTRIN) 200 MG tablet Take 200-400 mg by mouth every 6 (six) hours as needed (pain).   Yes Historical Provider, MD  lisinopril (PRINIVIL,ZESTRIL) 10 MG tablet Take 1 tablet (10 mg total) by mouth daily. 03/31/14  Yes Costin Karlyne Greenspan, MD  niacin (NIASPAN) 1000 MG CR tablet Take 1,000 mg by mouth at bedtime.   Yes Historical Provider, MD  Omega-3 Fatty Acids (FISH OIL) 1000 MG CAPS Take 2,000 mg by mouth 2 (two) times daily.   Yes Historical Provider, MD  Polyvinyl Alcohol-Povidone (REFRESH OP) Place 2 drops into both eyes daily as needed (dry eyes).   Yes Historical Provider, MD  simvastatin (ZOCOR) 20 MG tablet Take 20 mg by mouth at bedtime.  03/16/14  Yes Historical Provider, MD  vitamin E 400 UNIT capsule Take 400 Units by mouth daily.   Yes Historical Provider, MD  emollient (BIAFINE) cream Apply topically as needed. Patient taking differently: Apply 1 application topically daily as needed (itching, dry scalp).  04/06/14   Thea Silversmith, MD     History reviewed. No pertinent family history.  History   Social History  . Marital Status: Married    Spouse Name: N/A  . Number of Children: 1  . Years of Education: N/A   Social History Main Topics  . Smoking status: Former Smoker -- 1.00 packs/day for 30 years    Types: Cigarettes    Quit date: 03/29/2004  . Smokeless tobacco: Not on file  . Alcohol Use: Yes  . Drug Use: No  . Sexual Activity: Not on file   Other Topics Concern  . None   Social History Narrative     Review of Systems: A 12 point ROS discussed and pertinent positives are indicated  in the HPI above.  All other systems are negative.  Review of Systems  Constitutional: Positive for activity change. Negative for fever, appetite change and fatigue.  Respiratory: Negative for cough and shortness of breath.   Cardiovascular: Negative for chest pain.  Gastrointestinal: Negative for abdominal pain.  Genitourinary: Negative for difficulty urinating.  Musculoskeletal: Positive for gait problem. Negative for back pain.  Neurological: Positive for dizziness, speech difficulty and light-headedness.  Psychiatric/Behavioral: Negative for behavioral problems and confusion.       Confusion  resolved    Vital Signs: BP 135/78 mmHg  Pulse 65  Temp(Src) 97.5 F (36.4 C) (Oral)  Resp 18  Ht 5\' 8"  (1.727 m)  Wt 74.617 kg (164 lb 8 oz)  BMI 25.02 kg/m2  SpO2 100%  Physical Exam  Constitutional: She is oriented to person, place, and time. She appears well-nourished.  Cardiovascular: Normal rate, regular rhythm and normal heart sounds.   No murmur heard. Pulmonary/Chest: Effort normal and breath sounds normal. She has no wheezes.  Abdominal: Soft. Bowel sounds are normal. There is no tenderness.  Musculoskeletal: Normal range of motion.  Neurological: She is alert and oriented to person, place, and time.  Skin: Skin is warm and dry.  Psychiatric: She has a normal mood and affect. Her behavior is normal. Judgment normal.  Nursing note and vitals reviewed.   Mallampati Score:  MD Evaluation Airway: WNL Heart: WNL Abdomen: WNL Chest/ Lungs: WNL ASA  Classification: 3 Mallampati/Airway Score: One  Imaging: Ct Head Wo Contrast  03/28/2014   CLINICAL DATA:  Dizziness for 4-5 days, forgetfulness. History of hypertension and hyperlipidemia.  EXAM: CT HEAD WITHOUT CONTRAST  TECHNIQUE: Contiguous axial images were obtained from the base of the skull through the vertex without intravenous contrast.  COMPARISON:  None.  FINDINGS: Expansile vasogenic edema in LEFT upper lobe with mild mass effect on the LEFT lateral ventricle, no hydrocephalus. In addition, LEFT posterior frontal lobe encephalomalacia. Additional expansile hypodensities in the pons, RIGHT brachium pontis. Mild expansile hypodensity in RIGHT parietal lobe. No intraparenchymal hemorrhage. No acute large vascular territory infarct.  No abnormal extra-axial fluid collections. Basal cisterns are patent. Mild calcific atherosclerosis of the carotid siphons.  No skull fracture. The included ocular globes and orbital contents are non-suspicious. The mastoid aircells and included paranasal sinuses are well-aerated.   IMPRESSION: Multiple expansile hypodensities in the supra and infratentorial brain, largest in LEFT frontal lobe inferring underlying masses, concerning for metastatic disease (less likely infection) and would be better characterized on MRI of the brain with contrast.  Acute findings discussed with and reconfirmed by Dr.MELANIE BELFI on 03/28/2014 at 9:40 pm.   Electronically Signed   By: Elon Alas   On: 03/28/2014 21:41   Ct Chest W Contrast  03/29/2014   CLINICAL DATA:  Brain metastases.  Evaluate for primary source.  EXAM: CT CHEST, ABDOMEN, AND PELVIS WITH CONTRAST  TECHNIQUE: Multidetector CT imaging of the chest, abdomen and pelvis was performed following the standard protocol during bolus administration of intravenous contrast.  CONTRAST:  126mL OMNIPAQUE IOHEXOL 300 MG/ML  SOLN  COMPARISON:  CT chest 09/08/2007  FINDINGS: CT CHEST FINDINGS  Normal heart size. Normal caliber thoracic aorta. Great vessel origins are patent. Calcification of the aorta and Coronary arteries. Esophagus is decompressed.  Mass in the superior segment of the right lower lung with air bronchograms. The mass lesion measures about 4.8 x 4.5 cm. Prominent right hilar lymph node measuring 12 mm is probably metastatic. This likely represents  primary lung cancer. Dominant metastasis not entirely excluded. There are vague nodular ground-glass lesions demonstrated both lungs, measuring less than 1 cm diameter. These are likely metastatic. Focal areas scarring in the right middle lung is unchanged since prior study. No pneumothorax. No pleural effusions. Airways appear patent.  CT ABDOMEN AND PELVIS FINDINGS  Diffuse fatty infiltration of the liver. No focal liver lesions appreciated. The gallbladder, pancreas, spleen, adrenal glands, kidneys, inferior vena cava, and retroperitoneal lymph nodes are unremarkable. Calcification of the abdominal aorta without aneurysm. Small accessory spleen. Stomach, small bowel, and colon appear  normal for degree of distention. Contrast material flows through to the colon without evidence of bowel obstruction. No free air or free fluid in the abdomen.  Pelvis: Diverticulosis of the sigmoid colon without evidence of diverticulitis. Bladder wall is not thickened. Uterus and ovaries are not enlarged. Appendix is normal. No free or loculated pelvic fluid collections. No pelvic mass or lymphadenopathy. Bones: Degenerative changes in the thoracic and lumbar spine with normal alignment. No destructive bone lesions are identified.  IMPRESSION: 4.8 cm mass in the superior segment right lower lung likely represents primary neoplasm. Additional small nodular ground-glass lesions in both lungs may represent metastasis. Probable right hilar lymph node metastasis.  Diffuse fatty infiltration of the liver. No evidence of metastatic disease in the abdomen or pelvis.   Electronically Signed   By: Lucienne Capers M.D.   On: 03/29/2014 02:04   Mr Jeri Cos WC Contrast  03/28/2014   CLINICAL DATA:  Four-day history of dizziness, lightheadedness, speech difficulties. Gait instability.  EXAM: MRI HEAD WITHOUT AND WITH CONTRAST  TECHNIQUE: Multiplanar, multiecho pulse sequences of the brain and surrounding structures were obtained without and with intravenous contrast.  CONTRAST:  59mL MULTIHANCE GADOBENATE DIMEGLUMINE 529 MG/ML IV SOLN  COMPARISON:  CT of the head March 28, 2014 at 2116 hr  FINDINGS: No reduced diffusion to suggest acute ischemia, hypercellular large tumor or purulent material. No susceptibility artifact to suggest hemorrhage.  At least 4 infratentorially greater than 14 supratentorial intermediate FLAIR signal, T2 hyperintense peripherally enhancing cystic metastasis seen at the gray-white matter junction. Surrounding T2 bright vasogenic edema, most apparent within the LEFT frontal lobe with sulcal effacement, no midline shift. Mild T2 bright vasogenic edema within the pons partially effaces the pre pontine  cistern, with mild mass effect on the basilar artery. Mild white matter changes of exclusive of the aforementioned bowel abnormality most consistent chronic small vessel ischemic disease.  Small remote RIGHT cerebellar infarcts. No abnormal extra-axial fluid collections. No suspicious dural, leptomeningeal enhancement. No extra-axial masses. Major intracranial vascular flow voids are preserved at the skullbase.  No paranasal sinus air-fluid levels. Trace fluid signal in the RIGHT mastoid air cells.  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 Surrounding vasogenic edema, with pontine edema resulting in mild pre pontine cistern effacement. No midline shift.  Remote small RIGHT cerebellar infarcts.   Electronically Signed   By: Elon Alas   On: 03/28/2014 23:42   Ct Abdomen Pelvis W Contrast  03/29/2014   CLINICAL DATA:  Brain metastases.  Evaluate for primary source.  EXAM: CT CHEST, ABDOMEN, AND PELVIS WITH CONTRAST  TECHNIQUE: Multidetector CT imaging of the chest, abdomen and pelvis was performed following the standard protocol during bolus administration of intravenous contrast.  CONTRAST:  184mL OMNIPAQUE IOHEXOL 300 MG/ML  SOLN  COMPARISON:  CT chest 09/08/2007  FINDINGS: CT CHEST FINDINGS  Normal heart size. Normal caliber thoracic aorta.  Great vessel origins are patent. Calcification of the aorta and Coronary arteries. Esophagus is decompressed.  Mass in the superior segment of the right lower lung with air bronchograms. The mass lesion measures about 4.8 x 4.5 cm. Prominent right hilar lymph node measuring 12 mm is probably metastatic. This likely represents primary lung cancer. Dominant metastasis not entirely excluded. There are vague nodular ground-glass lesions demonstrated both lungs, measuring less than 1 cm diameter. These are likely metastatic. Focal areas scarring in the right middle lung is unchanged since prior study. No pneumothorax. No  pleural effusions. Airways appear patent.  CT ABDOMEN AND PELVIS FINDINGS  Diffuse fatty infiltration of the liver. No focal liver lesions appreciated. The gallbladder, pancreas, spleen, adrenal glands, kidneys, inferior vena cava, and retroperitoneal lymph nodes are unremarkable. Calcification of the abdominal aorta without aneurysm. Small accessory spleen. Stomach, small bowel, and colon appear normal for degree of distention. Contrast material flows through to the colon without evidence of bowel obstruction. No free air or free fluid in the abdomen.  Pelvis: Diverticulosis of the sigmoid colon without evidence of diverticulitis. Bladder wall is not thickened. Uterus and ovaries are not enlarged. Appendix is normal. No free or loculated pelvic fluid collections. No pelvic mass or lymphadenopathy. Bones: Degenerative changes in the thoracic and lumbar spine with normal alignment. No destructive bone lesions are identified.  IMPRESSION: 4.8 cm mass in the superior segment right lower lung likely represents primary neoplasm. Additional small nodular ground-glass lesions in both lungs may represent metastasis. Probable right hilar lymph node metastasis.  Diffuse fatty infiltration of the liver. No evidence of metastatic disease in the abdomen or pelvis.   Electronically Signed   By: Lucienne Capers M.D.   On: 03/29/2014 02:04   Nm Pet Image Initial (pi) Skull Base To Thigh  04/15/2014   CLINICAL DATA:  Initial treatment strategy for lung cancer with brain metastasis.  EXAM: NUCLEAR MEDICINE PET SKULL BASE TO THIGH  TECHNIQUE: 8.6 mCi F-18 FDG was injected intravenously. Full-ring PET imaging was performed from the skull base to thigh after the radiotracer. CT data was obtained and used for attenuation correction and anatomic localization.  FASTING BLOOD GLUCOSE:  Value: 130 mg/dl  COMPARISON:  Chest abdomen and pelvic CTs of 03/29/2014. Brain MR 03/28/2014.  FINDINGS: NECK  Intracranial metastasis. Focus of  hypermetabolism is identified about the left medial parietal lobe.  No evidence of hypermetabolic cervical metastasis.  CHEST  Superior segment right lower lobe lung mass with contact of the right major fissure. This measures 4.3 x 3.6 cm and a S.U.V. max of 8.9 on image 35.  No hypermetabolism within mediastinal or hilar nodes.  ABDOMEN/PELVIS  Hypermetabolism identified about the low anus. No well-defined mass in this area. This measures a S.U.V. max of 8.3.  SKELETON  Right greater trochanteric bursitis.  No suspicious osseous lesion.  CT IMAGES PERFORMED FOR ATTENUATION CORRECTION  No cervical adenopathy. Chest, abdomen, and pelvic findings deferred a prior diagnostic CTs. Multivessel coronary artery atherosclerosis. Mild cardiomegaly. Smaller pulmonary nodules are identified bilaterally. These are somewhat ill-defined and ground-glass in morphology. Nonspecific.  Normal adrenal glands. Moderate hepatic steatosis. Extensive colonic diverticulosis. Uterine fibroids.  IMPRESSION: 1. Superior segment right lower lobe lung mass, consistent with primary bronchogenic carcinoma. 2. 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 3. Otherwise, no evidence of metastatic disease. 4. Anal hypermetabolism which is indeterminate and may be physiologic. Consider physical exam correlation. 5. Incidental findings, including Coronary artery atherosclerosis, hepatic steatosis, and uterine fibroids.  Electronically Signed   By: Abigail Miyamoto M.D.   On: 04/15/2014 15:41   Dg C-arm Bronchoscopy  03/30/2014   CLINICAL DATA:    C-ARM BRONCHOSCOPY  Fluoroscopy was utilized by the requesting physician.  No radiographic  interpretation.     Labs:  CBC:  Recent Labs  03/28/14 1900 03/30/14 0447 04/20/14 0900  WBC 6.3 7.3 7.3  HGB 13.5 13.0 13.8  HCT 40.5 39.0 41.2  PLT 349 359 272    COAGS:  Recent Labs  03/28/14 1900 03/30/14 0447 04/20/14 0900  INR 0.99 1.01 0.96  APTT 28  --  22*     BMP:  Recent Labs  03/28/14 1900 03/30/14 0447  NA 137 138  K 3.6 4.5  CL 99 105  CO2 27 20  GLUCOSE 119* 152*  BUN 9 11  CALCIUM 10.1 9.3  CREATININE 0.80 0.83  GFRNONAA 74* 70*  GFRAA 85* 82*    LIVER FUNCTION TESTS:  Recent Labs  03/28/14 1900 03/30/14 0447  BILITOT 1.0 1.1  AST 33 27  ALT 29 23  ALKPHOS 49 37*  PROT 7.7 6.8  ALBUMIN 4.7 4.0    TUMOR MARKERS: No results for input(s): AFPTM, CEA, CA199, CHROMGRNA in the last 8760 hours.  Assessment and Plan:  Rt lung mass; +PET Non diag bronch 03/30/14 Brain mets Scheduled for lung mass bx in IR per Dr Servando Snare request Pt and husband aware of procedure benefits and risks including but not limited to: Infection; bleeding; pneumothorax; death Agreeable to proceed Consent signed andin chart   Thank you for this interesting consult.  I greatly enjoyed meeting Kanoe Kathman and look forward to participating in their care.  Signed: Rafael Salway A 04/20/2014, 10:22 AM   I spent a total of  20 Minutes in face to face in clinical consultation, greater than 50% of which was counseling/coordinating care for Rt lung mass bx

## 2014-04-20 NOTE — Progress Notes (Signed)
Pt is leaving for chest xray via w/c. Per tech Dr wrote on his order pt could travel via w/c. Order for cxr is not in que to verify. Pt is not sob, no signs of distress.

## 2014-04-20 NOTE — Procedures (Signed)
Interventional Radiology Procedure Note  Procedure: CT guided biopsy of superior segment, RLL pulmonary mass.  BioSentry device used.  Complications: No immediate Recommendations: - Bedrest until CXR cleared.  Minimize talking, coughing or otherwise straining.  - Follow up 2 hr CXR pending   Signed,  Criselda Peaches, MD

## 2014-04-20 NOTE — Discharge Instructions (Signed)
Needle Biopsy of Lung, Care After °Refer to this sheet in the next few weeks. These instructions provide you with information on caring for yourself after your procedure. Your health care provider may also give you more specific instructions. Your treatment has been planned according to current medical practices, but problems sometimes occur. Call your health care provider if you have any problems or questions after your procedure. °WHAT TO EXPECT AFTER THE PROCEDURE °· A bandage will be applied over the area where the needle was inserted. You may be asked to apply pressure to the bandage for several minutes to ensure there is minimal bleeding. °· In most cases, you can leave when your needle biopsy procedure is completed. Do not drive yourself home. Someone else should take you home. °· If you received an IV sedative or general anesthetic, you will be taken to a comfortable place to relax while the medicine wears off. °· If you have upcoming travel scheduled, talk to your health care provider about when it is safe to travel by air after the procedure. °HOME CARE INSTRUCTIONS °· Expect to take it easy for the rest of the day. °· Protect the area where you received the needle biopsy by keeping the bandage in place for as long as instructed. °· You may feel some mild pain or discomfort in the area, but this should stop in a day or two. °· Take medicines only as directed by your health care provider. °SEEK MEDICAL CARE IF:  °· You have pain at the biopsy site that worsens or is not helped by medicine. °· You have swelling or drainage at the needle biopsy site. °· You have a fever. °SEEK IMMEDIATE MEDICAL CARE IF:  °· You have new or worsening shortness of breath. °· You have chest pain. °· You are coughing up blood. °· You have bleeding that does not stop with pressure or a bandage. °· You develop light-headedness or fainting. °Document Released: 12/16/2006 Document Revised: 07/05/2013 Document Reviewed:  07/13/2012 °[employer]® Patient Information ©2015 [employer]. This information is not intended to replace advice given to you by your health care provider. Make sure you discuss any questions you have with your health care provider. ° °

## 2014-04-20 NOTE — Progress Notes (Signed)
  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   She was hospitalized at the end of treatment with a vasovagal/hypotensive episode.  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  Plan: The patient has completed radiation treatment. The patient will return to radiation oncology clinic for routine followup in one month. I advised them to call or return sooner if they have any questions or concerns related to their recovery or treatment.  ------------------------------------------------  Thea Silversmith, MD

## 2014-04-26 LAB — FUNGUS CULTURE W SMEAR: Fungal Smear: NONE SEEN

## 2014-04-27 ENCOUNTER — Encounter (HOSPITAL_COMMUNITY): Payer: Self-pay | Admitting: Emergency Medicine

## 2014-04-27 ENCOUNTER — Other Ambulatory Visit (HOSPITAL_COMMUNITY): Payer: Self-pay

## 2014-04-27 ENCOUNTER — Ambulatory Visit (HOSPITAL_BASED_OUTPATIENT_CLINIC_OR_DEPARTMENT_OTHER): Payer: PPO | Admitting: Nurse Practitioner

## 2014-04-27 ENCOUNTER — Emergency Department (HOSPITAL_COMMUNITY): Payer: PPO

## 2014-04-27 ENCOUNTER — Ambulatory Visit (HOSPITAL_BASED_OUTPATIENT_CLINIC_OR_DEPARTMENT_OTHER): Payer: PPO

## 2014-04-27 ENCOUNTER — Observation Stay (HOSPITAL_COMMUNITY)
Admission: EM | Admit: 2014-04-27 | Discharge: 2014-04-28 | Disposition: A | Discharge status: Home / Self Care | Payer: PPO | Attending: Internal Medicine | Admitting: Internal Medicine

## 2014-04-27 ENCOUNTER — Other Ambulatory Visit: Payer: Self-pay

## 2014-04-27 VITALS — BP 126/85 | HR 92 | Temp 98.5°F | Resp 18 | Ht 68.0 in | Wt 163.9 lb

## 2014-04-27 DIAGNOSIS — Z7982 Long term (current) use of aspirin: Secondary | ICD-10-CM | POA: Insufficient documentation

## 2014-04-27 DIAGNOSIS — R110 Nausea: Secondary | ICD-10-CM

## 2014-04-27 DIAGNOSIS — E785 Hyperlipidemia, unspecified: Secondary | ICD-10-CM

## 2014-04-27 DIAGNOSIS — Z87891 Personal history of nicotine dependence: Secondary | ICD-10-CM | POA: Insufficient documentation

## 2014-04-27 DIAGNOSIS — C7931 Secondary malignant neoplasm of brain: Secondary | ICD-10-CM | POA: Diagnosis not present

## 2014-04-27 DIAGNOSIS — Z791 Long term (current) use of non-steroidal anti-inflammatories (NSAID): Secondary | ICD-10-CM | POA: Diagnosis not present

## 2014-04-27 DIAGNOSIS — Z79899 Other long term (current) drug therapy: Secondary | ICD-10-CM | POA: Diagnosis not present

## 2014-04-27 DIAGNOSIS — C3431 Malignant neoplasm of lower lobe, right bronchus or lung: Secondary | ICD-10-CM | POA: Insufficient documentation

## 2014-04-27 DIAGNOSIS — I10 Essential (primary) hypertension: Secondary | ICD-10-CM | POA: Insufficient documentation

## 2014-04-27 DIAGNOSIS — R269 Unspecified abnormalities of gait and mobility: Secondary | ICD-10-CM

## 2014-04-27 DIAGNOSIS — R4182 Altered mental status, unspecified: Secondary | ICD-10-CM

## 2014-04-27 DIAGNOSIS — E780 Pure hypercholesterolemia: Secondary | ICD-10-CM | POA: Insufficient documentation

## 2014-04-27 DIAGNOSIS — R55 Syncope and collapse: Principal | ICD-10-CM

## 2014-04-27 DIAGNOSIS — C3490 Malignant neoplasm of unspecified part of unspecified bronchus or lung: Secondary | ICD-10-CM | POA: Diagnosis present

## 2014-04-27 DIAGNOSIS — I959 Hypotension, unspecified: Secondary | ICD-10-CM

## 2014-04-27 HISTORY — DX: Unspecified glaucoma: H40.9

## 2014-04-27 HISTORY — DX: Malignant neoplasm of unspecified part of unspecified bronchus or lung: C34.90

## 2014-04-27 LAB — CBC WITH DIFFERENTIAL/PLATELET
Basophils Absolute: 0 10*3/uL (ref 0.0–0.1)
Basophils Relative: 0 % (ref 0–1)
EOS PCT: 1 % (ref 0–5)
Eosinophils Absolute: 0.1 10*3/uL (ref 0.0–0.7)
HEMATOCRIT: 40.5 % (ref 36.0–46.0)
Hemoglobin: 13.4 g/dL (ref 12.0–15.0)
LYMPHS ABS: 0.7 10*3/uL (ref 0.7–4.0)
Lymphocytes Relative: 9 % — ABNORMAL LOW (ref 12–46)
MCH: 33.9 pg (ref 26.0–34.0)
MCHC: 33.1 g/dL (ref 30.0–36.0)
MCV: 102.5 fL — ABNORMAL HIGH (ref 78.0–100.0)
MONO ABS: 0.4 10*3/uL (ref 0.1–1.0)
Monocytes Relative: 6 % (ref 3–12)
Neutro Abs: 6.3 10*3/uL (ref 1.7–7.7)
Neutrophils Relative %: 84 % — ABNORMAL HIGH (ref 43–77)
Platelets: 233 10*3/uL (ref 150–400)
RBC: 3.95 MIL/uL (ref 3.87–5.11)
RDW: 12.9 % (ref 11.5–15.5)
WBC: 7.4 10*3/uL (ref 4.0–10.5)

## 2014-04-27 LAB — COMPREHENSIVE METABOLIC PANEL
ALBUMIN: 4 g/dL (ref 3.5–5.2)
ALT: 62 U/L — ABNORMAL HIGH (ref 0–35)
AST: 38 U/L — AB (ref 0–37)
Alkaline Phosphatase: 41 U/L (ref 39–117)
Anion gap: 10 (ref 5–15)
BUN: 14 mg/dL (ref 6–23)
CO2: 26 mmol/L (ref 19–32)
CREATININE: 0.72 mg/dL (ref 0.50–1.10)
Calcium: 9.4 mg/dL (ref 8.4–10.5)
Chloride: 96 mmol/L (ref 96–112)
GFR calc Af Amer: >90 mL/min (ref >90)
GFR calc non Af Amer: 86 mL/min — ABNORMAL LOW (ref >90)
Glucose, Bld: 129 mg/dL — ABNORMAL HIGH (ref 70–99)
POTASSIUM: 4 mmol/L (ref 3.5–5.1)
Sodium: 132 mmol/L — ABNORMAL LOW (ref 135–145)
Total Bilirubin: 1.5 mg/dL — ABNORMAL HIGH (ref 0.3–1.2)
Total Protein: 6.3 g/dL (ref 6.0–8.3)

## 2014-04-27 LAB — URINALYSIS, ROUTINE W REFLEX MICROSCOPIC
Glucose, UA: NEGATIVE mg/dL
HGB URINE DIPSTICK: NEGATIVE
Ketones, ur: NEGATIVE mg/dL
LEUKOCYTES UA: NEGATIVE
NITRITE: NEGATIVE
PROTEIN: NEGATIVE mg/dL
Specific Gravity, Urine: 1.023 (ref 1.005–1.030)
UROBILINOGEN UA: 0.2 mg/dL (ref 0.0–1.0)
pH: 7 (ref 5.0–8.0)

## 2014-04-27 LAB — I-STAT TROPONIN, ED: TROPONIN I, POC: 0 ng/mL (ref 0.00–0.08)

## 2014-04-27 LAB — CBG MONITORING, ED: Glucose-Capillary: 110 mg/dL — ABNORMAL HIGH (ref 70–99)

## 2014-04-27 LAB — WHOLE BLOOD GLUCOSE: GLUCOSE: 139 mg/dL — AB (ref 70–100)

## 2014-04-27 LAB — I-STAT CG4 LACTIC ACID, ED: LACTIC ACID, VENOUS: 1.06 mmol/L (ref 0.5–2.0)

## 2014-04-27 MED ORDER — SODIUM CHLORIDE 0.9 % IV SOLN
INTRAVENOUS | Status: DC
  Administered 2014-04-27 (×2): via INTRAVENOUS

## 2014-04-27 MED ORDER — VITAMIN D3 25 MCG (1000 UNIT) PO TABS
2000.0000 [IU] | ORAL_TABLET | Freq: Every day | ORAL | Status: DC
  Administered 2014-04-28: 2000 [IU] via ORAL
  Filled 2014-04-27 (×2): qty 2

## 2014-04-27 MED ORDER — CITALOPRAM HYDROBROMIDE 20 MG PO TABS
20.0000 mg | ORAL_TABLET | Freq: Every day | ORAL | Status: DC
  Administered 2014-04-28: 20 mg via ORAL
  Filled 2014-04-27: qty 1

## 2014-04-27 MED ORDER — SIMVASTATIN 20 MG PO TABS
20.0000 mg | ORAL_TABLET | Freq: Every day | ORAL | Status: DC
  Administered 2014-04-27: 20 mg via ORAL
  Filled 2014-04-27: qty 1

## 2014-04-27 MED ORDER — OMEGA-3-ACID ETHYL ESTERS 1 G PO CAPS
2.0000 g | ORAL_CAPSULE | Freq: Every day | ORAL | Status: DC
  Administered 2014-04-28: 2 g via ORAL
  Filled 2014-04-27: qty 2

## 2014-04-27 MED ORDER — DEXAMETHASONE SODIUM PHOSPHATE 10 MG/ML IJ SOLN
INTRAMUSCULAR | Status: AC
  Filled 2014-04-27: qty 1

## 2014-04-27 MED ORDER — ASPIRIN EC 81 MG PO TBEC: 81.0000 mg | DELAYED_RELEASE_TABLET | Freq: Every day | ORAL | Status: DC

## 2014-04-27 MED ORDER — SODIUM CHLORIDE 0.9 % IJ SOLN
3.0000 mL | Freq: Two times a day (BID) | INTRAMUSCULAR | Status: DC
  Administered 2014-04-27: 3 mL via INTRAVENOUS

## 2014-04-27 MED ORDER — NIACIN ER (ANTIHYPERLIPIDEMIC) 500 MG PO TBCR
1000.0000 mg | EXTENDED_RELEASE_TABLET | Freq: Every day | ORAL | Status: DC
  Filled 2014-04-27: qty 2

## 2014-04-27 MED ORDER — CALCIUM CITRATE-VITAMIN D 500-400 MG-UNIT PO CHEW
1.0000 | CHEWABLE_TABLET | Freq: Two times a day (BID) | ORAL | Status: DC
  Administered 2014-04-28: 1 via ORAL
  Filled 2014-04-27 (×3): qty 1

## 2014-04-27 MED ORDER — ALUM & MAG HYDROXIDE-SIMETH 200-200-20 MG/5ML PO SUSP
30.0000 mL | Freq: Four times a day (QID) | ORAL | Status: DC | PRN
Start: 2014-04-27 — End: 2014-04-28

## 2014-04-27 MED ORDER — VITAMIN E 180 MG (400 UNIT) PO CAPS
400.0000 [IU] | ORAL_CAPSULE | Freq: Every day | ORAL | Status: DC
  Administered 2014-04-28: 400 [IU] via ORAL
  Filled 2014-04-27: qty 1

## 2014-04-27 MED ORDER — SODIUM CHLORIDE 0.9 % IJ SOLN: 3.0000 mL | INTRAMUSCULAR | Status: DC | PRN

## 2014-04-27 MED ORDER — DEXAMETHASONE SODIUM PHOSPHATE 10 MG/ML IJ SOLN
10.0000 mg | Freq: Once | INTRAMUSCULAR | Status: AC
  Administered 2014-04-27: 10 mg via INTRAVENOUS

## 2014-04-27 MED ORDER — VITAMIN D3 50 MCG (2000 UT) PO TABS: 2000.0000 [IU] | ORAL_TABLET | Freq: Every day | ORAL | Status: DC

## 2014-04-27 MED ORDER — POLYVINYL ALCOHOL 1.4 % OP SOLN
2.0000 [drp] | Freq: Three times a day (TID) | OPHTHALMIC | Status: DC | PRN
  Filled 2014-04-27: qty 15

## 2014-04-27 MED ORDER — SODIUM CHLORIDE 0.9 % IJ SOLN: 3.0000 mL | Freq: Two times a day (BID) | INTRAMUSCULAR | Status: DC

## 2014-04-27 MED ORDER — SODIUM CHLORIDE 0.9 % IV SOLN: 250.0000 mL | INTRAVENOUS | Status: DC | PRN

## 2014-04-27 MED ORDER — IOHEXOL 350 MG/ML SOLN
100.0000 mL | Freq: Once | INTRAVENOUS | Status: AC | PRN
  Administered 2014-04-27: 100 mL via INTRAVENOUS

## 2014-04-27 MED ORDER — ENOXAPARIN SODIUM 40 MG/0.4ML ~~LOC~~ SOLN
40.0000 mg | SUBCUTANEOUS | Status: DC
  Administered 2014-04-28: 40 mg via SUBCUTANEOUS
  Filled 2014-04-27: qty 0.4

## 2014-04-27 MED ORDER — LISINOPRIL 10 MG PO TABS
10.0000 mg | ORAL_TABLET | Freq: Every day | ORAL | Status: DC
  Administered 2014-04-28: 10 mg via ORAL
  Filled 2014-04-27: qty 1

## 2014-04-27 MED ORDER — DOCUSATE SODIUM 100 MG PO CAPS
200.0000 mg | ORAL_CAPSULE | Freq: Every evening | ORAL | Status: DC | PRN
  Administered 2014-04-27: 100 mg via ORAL
  Filled 2014-04-27: qty 2

## 2014-04-27 MED ORDER — IBUPROFEN 200 MG PO TABS
200.0000 mg | ORAL_TABLET | Freq: Four times a day (QID) | ORAL | Status: DC | PRN
  Administered 2014-04-28: 400 mg via ORAL
  Filled 2014-04-27: qty 2

## 2014-04-27 MED ORDER — DEXAMETHASONE SODIUM PHOSPHATE 4 MG/ML IJ SOLN
4.0000 mg | Freq: Two times a day (BID) | INTRAMUSCULAR | Status: DC
  Administered 2014-04-27 – 2014-04-28 (×2): 4 mg via INTRAVENOUS
  Filled 2014-04-27 (×2): qty 1

## 2014-04-27 NOTE — ED Provider Notes (Signed)
CSN: [id number]     Arrival date & time 04/27/14  1331 History   First MD Initiated Contact with Patient 04/27/14 1334     Chief Complaint  Patient presents with  . Loss of Consciousness     (Consider location/radiation/quality/duration/timing/severity/associated sxs/prior Treatment) Patient is a 69 y.o. female presenting with syncope.  Loss of Consciousness Episode history:  Multiple Most recent episode:  Today Duration: brief. Timing:  Constant Progression:  Resolved Chronicity:  New Context: normal activity   Witnessed: yes   Relieved by:  Nothing Worsened by:  Nothing tried Ineffective treatments:  None tried Associated symptoms: no anxiety, no dizziness, no nausea, no shortness of breath and no vomiting     Past Medical History  Diagnosis Date  . Hypertension   . High cholesterol   . 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 Glaucoma    Past Surgical History  Procedure Laterality Date  . Video bronchoscopy Bilateral 03/30/2014    Procedure: VIDEO BRONCHOSCOPY WITH FLUORO;  Surgeon: Rush Farmer, MD;  Location: [facility];  Service: Cardiopulmonary;  Laterality: Bilateral;  . Hernia repair     Family History  Problem Relation Age of Onset  . Heart failure Mother     Died age 13  . COPD Mother   . Cancer - Colon Father     Died age 6  . Glaucoma Father   . COPD Sister   . COPD Brother    History  Substance Use Topics  . Smoking status: Former Smoker -- 1.00 packs/day for 30 years    Types: Cigarettes    Quit date: 03/29/2004  . Smokeless tobacco: Not on file  . Alcohol Use: 0.0 oz/week    0 Standard drinks or equivalent per week     Comment: Occasional, up to 2 drinks a few times a wek.   OB History    No data available     Review of Systems  Respiratory: Negative for shortness of breath.   Cardiovascular: Positive for syncope.  Gastrointestinal: Negative for nausea and vomiting.  Neurological: Negative for dizziness.  All  other systems reviewed and are negative.     Allergies  Nickel  Home Medications   Prior to Admission medications   Medication Sig Start Date End Date Taking? Authorizing Provider  aspirin EC 81 MG tablet Take 81 mg by mouth at bedtime.   Yes Historical Provider, MD  Calcium Carb-Cholecalciferol (CALCIUM-VITAMIN D) 600-400 MG-UNIT TABS Take 600 mg by mouth 2 (two) times daily.    Yes Historical Provider, MD  Cholecalciferol (VITAMIN D3) 2000 UNITS TABS Take 2,000 Units by mouth at bedtime.    Yes Historical Provider, MD  citalopram (CELEXA) 20 MG tablet Take 20 mg by mouth daily.   Yes Historical Provider, MD  ibuprofen (ADVIL,MOTRIN) 200 MG tablet Take 200-400 mg by mouth every 6 (six) hours as needed for moderate pain (pain).    Yes Historical Provider, MD  lisinopril (PRINIVIL,ZESTRIL) 10 MG tablet Take 1 tablet (10 mg total) by mouth daily. 03/31/14  Yes Costin Karlyne Greenspan, MD  niacin (NIASPAN) 1000 MG CR tablet Take 1,000 mg by mouth at bedtime.   Yes Historical Provider, MD  Omega-3 Fatty Acids (FISH OIL) 1000 MG CAPS Take 2,000 mg by mouth 2 (two) times daily.   Yes Historical Provider, MD  simvastatin (ZOCOR) 20 MG tablet Take 20 mg by mouth at bedtime.  03/16/14  Yes Historical Provider, MD  vitamin E 400  UNIT capsule Take 400 Units by mouth daily.   Yes Historical Provider, MD  emollient (BIAFINE) cream Apply topically as needed. Patient not taking: Reported on 04/27/2014 04/06/14   Thea Silversmith, MD  Polyvinyl Alcohol-Povidone (REFRESH OP) Place 2 drops into both eyes daily as needed (dry eyes).    Historical Provider, MD   BP 140/89 mmHg  Pulse 74  Temp(Src) 97.9 F (36.6 C) (Oral)  Resp 18  Ht 5\' 8"  (1.727 m)  Wt 163 lb 14.4 oz (74.345 kg)  BMI 24.93 kg/m2  SpO2 100% Physical Exam  Constitutional: She is oriented to person, place, and time. She appears well-developed and well-nourished.  HENT:  Head: Normocephalic and atraumatic.  Right Ear: External ear normal.  Left  Ear: External ear normal.  Eyes: Conjunctivae and EOM are normal. Pupils are equal, round, and reactive to light.  Neck: Normal range of motion. Neck supple.  Cardiovascular: Normal rate, regular rhythm, normal heart sounds and intact distal pulses.   Pulmonary/Chest: Effort normal and breath sounds normal.  Abdominal: Soft. Bowel sounds are normal. There is no tenderness.  Musculoskeletal: Normal range of motion.  Neurological: She is alert and oriented to person, place, and time. She has normal strength and normal reflexes. No cranial nerve deficit or sensory deficit.  Skin: Skin is warm and dry.  Vitals reviewed.   ED Course  Procedures (including critical care time) Labs Review Labs Reviewed  CBC WITH DIFFERENTIAL/PLATELET - Abnormal; Notable for the following:    MCV 102.5 (*)    Neutrophils Relative % 84 (*)    Lymphocytes Relative 9 (*)    All other components within normal limits  COMPREHENSIVE METABOLIC PANEL - Abnormal; Notable for the following:    Sodium 132 (*)    Glucose, Bld 129 (*)    AST 38 (*)    ALT 62 (*)    Total Bilirubin 1.5 (*)    GFR calc non Af Amer 86 (*)    All other components within normal limits  URINALYSIS, ROUTINE W REFLEX MICROSCOPIC - Abnormal; Notable for the following:    Bilirubin Urine SMALL (*)    All other components within normal limits  CBG MONITORING, ED - Abnormal; Notable for the following:    Glucose-Capillary 110 (*)    All other components within normal limits  I-STAT CG4 LACTIC ACID, ED  Randolm Idol, ED    Imaging Review Dg Chest 2 View  04/27/2014   CLINICAL DATA:  69 year old female with dizziness and loss of consciousness. Right lower lobe lung mass. Initial encounter.  EXAM: CHEST  2 VIEW  COMPARISON:  04/20/2014 and earlier.  FINDINGS: Semi upright AP and lateral views of the chest. Radiographically stable superior segment right lower lobe masslike opacity. Chronic right middle lobe streaky opacity appears stable. No  pneumothorax or pulmonary edema. No pleural effusion or new pulmonary opacity. Stable mediastinal contour. Visualized tracheal air column is within normal limits. No acute osseous abnormality identified.  IMPRESSION: Radiographically stable right lung mass. No new cardiopulmonary abnormality identified.   Electronically Signed   By: Genevie Ann M.D.   On: 04/27/2014 14:55   Ct Head Wo Contrast  04/27/2014   CLINICAL DATA:  Syncopal episodes known history of lung carcinoma with metastatic lesions in the brain.  EXAM: CT HEAD WITHOUT CONTRAST  TECHNIQUE: Contiguous axial images were obtained from the base of the skull through the vertex without intravenous contrast.  COMPARISON:  03/28/2014  FINDINGS: Bony calvarium is intact. There again noted  rounded hypodensities identified within the pons, cerebellum and cerebral hemispheres particularly on the left consistent with the known history of metastatic disease. Diffuse vasogenic edema in the left frontal lobe is again seen and stable. Mild chronic white matter ischemic changes. No findings to suggest acute hemorrhage or acute infarction are noted.  IMPRESSION: Multifocal metastatic disease not significantly changed from the prior CT examination.   Electronically Signed   By: Inez Catalina M.D.   On: 04/27/2014 16:25   Ct Angio Chest Pe W/cm &/or Wo Cm  04/27/2014   CLINICAL DATA:  Syncope.  Lung cancer.  EXAM: CT ANGIOGRAPHY CHEST WITH CONTRAST  TECHNIQUE: Multidetector CT imaging of the chest was performed using the standard protocol during bolus administration of intravenous contrast. Multiplanar CT image reconstructions and MIPs were obtained to evaluate the vascular anatomy.  CONTRAST:  172mL OMNIPAQUE IOHEXOL 350 MG/ML SOLN  COMPARISON:  PET-CT dated 04/15/2014.  CT chest dated 03/29/2014.  FINDINGS: No evidence of pulmonary embolism.  Mediastinum/Nodes: Cardiomegaly.  No pericardial effusion.  Coronary atherosclerosis in the LAD and right main coronary artery.   Atherosclerotic calcifications of the aortic arch.  5 mm short axis right paratracheal node (series 4/image 20). 10 mm short axis node in the right azygoesophageal recess (series 4/image 43). No appreciable hypermetabolism on prior PET.  Lungs/Pleura: 3.4 x 4.5 x 4.2 cm right lower lobe mass which abuts the right major fissure. Minimal adjacent satellite nodularity (series 8/ image 43).  Mild dependent atelectasis in the bilateral lower lobes.  Mild paraseptal emphysematous changes.  No pleural effusion or pneumothorax.  Upper abdomen: Visualized upper abdomen is unremarkable.  Musculoskeletal: Mild degenerative changes of the thoracic spine.  Review of the MIP images confirms the above findings.  IMPRESSION: No evidence of pulmonary embolism.  4.5 cm right lower lobe mass which abuts the right major fissure, compatible with known primary bronchogenic neoplasm.  No evidence of metastatic disease in the chest.   Electronically Signed   By: Julian Hy M.D.   On: 04/27/2014 16:26     EKG Interpretation None      MDM   Final diagnoses:  Syncope, unspecified syncope type    69 y.o. female with pertinent PMH of NSCLC with brain mets presents after new onset syncope x 2 while at the cancer center for discussion of discovery of NSCLC.  No ho vasovagl syncope.  BP dropped to 83M systolic on both occasions, was not connected to telemetry at that time.  No chest pain.  She has odd affect, but is oriented on arrival and per family is at baseline. Physical exam without focal neuro deficit or other abnormality.    No chest pain, dyspnea, or other symptoms outside of syncopal events.    Consulted medicine for admission.    I have reviewed all laboratory and imaging studies if ordered as above  1. Syncope, unspecified syncope type         Debby Freiberg, MD 04/28/14 [phone]

## 2014-04-27 NOTE — ED Notes (Signed)
Bed: RESB Expected date:  Expected time:  Means of arrival:  Comments: [facility] from [facility]- hypotension

## 2014-04-27 NOTE — H&P (Addendum)
History and Physical:    Lori Park JWJ:[id number] DOB: 14-May-1945 DOA: 04/27/2014  Referring physician: Dr. Debby Freiberg PCP: Shirline Frees, MD   Chief Complaint: Syncope  History of Present Illness:   Lori Park is an 69 y.o. 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  There were no prodromal symptoms.  She was unresponsive for 1-2 minutes with a BP of 66/44 and was clammy.  She was given IVF and decadron, and sent to the ED for further evaluation.  She currently feels fine. She reports having had some nausea after she came to. The patient reports that she came to be diagnosed originally, after presenting with a gait abnormality and some mental status changes. She states that while she was on Decadron, her gait improved, but since tapering off, her gait has become unstable again.  ROS:   Constitutional: No fever, no chills;  Appetite normal; No weight loss, no weight gain, + fatigue.  HEENT: + blurry vision, no diplopia, no pharyngitis, no dysphagia, + metallic taste in mouth CV: No chest pain, no palpitations, no PND, no orthopnea, no edema.  Resp: No SOB, + occasional cough, no pleuritic pain. GI: + nausea, no vomiting, no diarrhea, no melena, no hematochezia, + constipation, no abdominal pain.  GU: No dysuria, no hematuria, + frequency/nocturia, no urgency. MSK: no myalgias, no arthralgias, right lower calf pain x 2 days, +generalized weakness.  Neuro:  + headache, no focal neurological deficits, no history of seizures, + gait abnormalities.  Psych: No depression, no anxiety.  Endo: No heat intolerance, no cold intolerance, no polyuria, no polydipsia  Skin: No  rashes, no skin lesions.  Heme: + easy bruising.  Travel history: No recent travel.   Past Medical History:   Past Medical History  Diagnosis Date  . Hypertension   . High cholesterol   . 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 Glaucoma     Past Surgical History:   Past Surgical History  Procedure Laterality Date  . Video bronchoscopy Bilateral 03/30/2014    Procedure: VIDEO BRONCHOSCOPY WITH FLUORO;  Surgeon: Rush Farmer, MD;  Location: [place];  Service: Cardiopulmonary;  Laterality: Bilateral;  . Hernia repair      Social History:   History   Social History  . Marital Status: Married    Spouse Name: Herbie Baltimore  . Number of Children: 1  . Years of Education: N/A   Occupational History  . Retired from [employer] research (medical)    Social History Main Topics  . Smoking status: Former Smoker -- 1.00 packs/day for 30 years    Types: Cigarettes    Quit date: 03/29/2004  . Smokeless tobacco: Not on file  . Alcohol Use: 0.0 oz/week    0 Standard drinks or equivalent per week     Comment: Occasional, up to 2 drinks a few times a wek.  . Drug Use: No  . Sexual Activity: Not on file   Other Topics Concern  . Not on file   Social History Narrative   Married.  Ambulates with a cane.    Family history:   Family History  Problem Relation Age of Onset  . Heart failure Mother  Died age 38  . COPD Mother   . Cancer - Colon Father     Died age 65  . Glaucoma Father   . COPD Sister   . COPD Brother     Allergies   Nickel  Current Medications:   Prior to Admission medications   Medication Sig Start Date End Date Taking? Authorizing Provider  aspirin EC 81 MG tablet Take 81 mg by mouth at bedtime.   Yes Historical Provider, MD  Calcium Carb-Cholecalciferol (CALCIUM-VITAMIN D) 600-400 MG-UNIT TABS Take 600 mg by mouth 2 (two) times daily.    Yes Historical Provider, MD  Cholecalciferol (VITAMIN D3) 2000 UNITS TABS Take 2,000 Units by mouth at bedtime.     Yes Historical Provider, MD  citalopram (CELEXA) 20 MG tablet Take 20 mg by mouth daily.   Yes Historical Provider, MD  ibuprofen (ADVIL,MOTRIN) 200 MG tablet Take 200-400 mg by mouth every 6 (six) hours as needed for moderate pain (pain).    Yes Historical Provider, MD  lisinopril (PRINIVIL,ZESTRIL) 10 MG tablet Take 1 tablet (10 mg total) by mouth daily. 03/31/14  Yes Costin Karlyne Greenspan, MD  niacin (NIASPAN) 1000 MG CR tablet Take 1,000 mg by mouth at bedtime.   Yes Historical Provider, MD  Omega-3 Fatty Acids (FISH OIL) 1000 MG CAPS Take 2,000 mg by mouth 2 (two) times daily.   Yes Historical Provider, MD  simvastatin (ZOCOR) 20 MG tablet Take 20 mg by mouth at bedtime.  03/16/14  Yes Historical Provider, MD  vitamin E 400 UNIT capsule Take 400 Units by mouth daily.   Yes Historical Provider, MD  emollient (BIAFINE) cream Apply topically as needed. Patient not taking: Reported on 04/27/2014 04/06/14   Thea Silversmith, MD  Polyvinyl Alcohol-Povidone (REFRESH OP) Place 2 drops into both eyes daily as needed (dry eyes).    Historical Provider, MD    Physical Exam:   Filed Vitals:   04/27/14 1331 04/27/14 1516 04/27/14 1626  BP: 128/73 129/78 137/76  Pulse: 71 75 83  Temp: 97.6 F (36.4 C)    TempSrc: Oral    Resp: 20 25 20   SpO2: 98% 99% 95%     Physical Exam: Blood pressure 137/76, pulse 83, temperature 97.6 F (36.4 C), temperature source Oral, resp. rate 20, SpO2 95 %. Gen: No acute distress. Head: Normocephalic, atraumatic. Eyes: PERRL, EOMI, sclerae nonicteric. Mouth: Oropharynx clear. No posterior pharyngeal exudates. Neck: Supple, no thyromegaly, no lymphadenopathy, no jugular venous distention. Chest: Lungs clear to auscultation bilaterally. CV: Heart sounds are regular. No murmurs, rubs, or gallops. Abdomen: Soft, nontender, nondistended with normal active bowel sounds. Extremities: Extremities are without clubbing, edema, or cyanosis. Skin: Warm and dry. Ecchymosis left  forearm. Neuro: Alert and oriented times 3; cranial nerves II through XII grossly intact. Psych: Mood and affect flat/depressed.   Data Review:    Labs: Basic Metabolic Panel:  Recent Labs Lab 04/27/14 1428  NA 132*  K 4.0  CL 96  CO2 26  GLUCOSE 129*  BUN 14  CREATININE 0.72  CALCIUM 9.4   Liver Function Tests:  Recent Labs Lab 04/27/14 1428  AST 38*  ALT 62*  ALKPHOS 41  BILITOT 1.5*  PROT 6.3  ALBUMIN 4.0   CBC:  Recent Labs Lab 04/27/14 1428  WBC 7.4  NEUTROABS 6.3  HGB 13.4  HCT 40.5  MCV 102.5*  PLT 233   CBG:  Recent Labs Lab 04/27/14 1411  GLUCAP 110*    Radiographic Studies: Dg Chest 2  View  04/27/2014   CLINICAL DATA:  69 year old female with dizziness and loss of consciousness. Right lower lobe lung mass. Initial encounter.  EXAM: CHEST  2 VIEW  COMPARISON:  04/20/2014 and earlier.  FINDINGS: Semi upright AP and lateral views of the chest. Radiographically stable superior segment right lower lobe masslike opacity. Chronic right middle lobe streaky opacity appears stable. No pneumothorax or pulmonary edema. No pleural effusion or new pulmonary opacity. Stable mediastinal contour. Visualized tracheal air column is within normal limits. No acute osseous abnormality identified.  IMPRESSION: Radiographically stable right lung mass. No new cardiopulmonary abnormality identified.   Electronically Signed   By: Genevie Ann M.D.   On: 04/27/2014 14:55   Ct Head Wo Contrast  04/27/2014   CLINICAL DATA:  Syncopal episodes known history of lung carcinoma with metastatic lesions in the brain.  EXAM: CT HEAD WITHOUT CONTRAST  TECHNIQUE: Contiguous axial images were obtained from the base of the skull through the vertex without intravenous contrast.  COMPARISON:  03/28/2014  FINDINGS: Bony calvarium is intact. There again noted rounded hypodensities identified within the pons, cerebellum and cerebral hemispheres particularly on the left consistent with the known  history of metastatic disease. Diffuse vasogenic edema in the left frontal lobe is again seen and stable. Mild chronic white matter ischemic changes. No findings to suggest acute hemorrhage or acute infarction are noted.  IMPRESSION: Multifocal metastatic disease not significantly changed from the prior CT examination.   Electronically Signed   By: Inez Catalina M.D.   On: 04/27/2014 16:25   Ct Angio Chest Pe W/cm &/or Wo Cm  04/27/2014   CLINICAL DATA:  Syncope.  Lung cancer.  EXAM: CT ANGIOGRAPHY CHEST WITH CONTRAST  TECHNIQUE: Multidetector CT imaging of the chest was performed using the standard protocol during bolus administration of intravenous contrast. Multiplanar CT image reconstructions and MIPs were obtained to evaluate the vascular anatomy.  CONTRAST:  111mL OMNIPAQUE IOHEXOL 350 MG/ML SOLN  COMPARISON:  PET-CT dated 04/15/2014.  CT chest dated 03/29/2014.  FINDINGS: No evidence of pulmonary embolism.  Mediastinum/Nodes: Cardiomegaly.  No pericardial effusion.  Coronary atherosclerosis in the LAD and right main coronary artery.  Atherosclerotic calcifications of the aortic arch.  5 mm short axis right paratracheal node (series 4/image 20). 10 mm short axis node in the right azygoesophageal recess (series 4/image 43). No appreciable hypermetabolism on prior PET.  Lungs/Pleura: 3.4 x 4.5 x 4.2 cm right lower lobe mass which abuts the right major fissure. Minimal adjacent satellite nodularity (series 8/ image 43).  Mild dependent atelectasis in the bilateral lower lobes.  Mild paraseptal emphysematous changes.  No pleural effusion or pneumothorax.  Upper abdomen: Visualized upper abdomen is unremarkable.  Musculoskeletal: Mild degenerative changes of the thoracic spine.  Review of the MIP images confirms the above findings.  IMPRESSION: No evidence of pulmonary embolism.  4.5 cm right lower lobe mass which abuts the right major fissure, compatible with known primary bronchogenic neoplasm.  No evidence of  metastatic disease in the chest.   Electronically Signed   By: Julian Hy M.D.   On: 04/27/2014 16:26    EKG: Independently reviewed. Normal sinus rhythm with no arrhythmias or ischemic changes noted.   Assessment/Plan:   Principal Problem:   Syncope / hypotension  Differential is a vasovagal reaction to receiving bad news versus hypotension in the setting of steroid withdrawal.  We'll monitor on telemetry overnight.  Resume Decadron, 4 mg IV every 12 hours with slow taper.  Gently hydrate  overnight.  CT angiogram negative for pulmonary embolism.  Active Problems:   Brain metastases / gait abnormality  Patient has completed whole brain radiation.  Resume Decadron at 4 mg IV every 12 hours with slow taper.  Physical therapy evaluation requested.    Non-small cell lung cancer, stage IV with brain metastasis  We'll notify Dr. Benay Spice of the patient's admission via [place].    Hyperlipidemia  Continue statin, omega-3 fatty acids.    DVT prophylaxis  Lovenox ordered.  Code Status: Full.  Discussed with the patient and her husband. Husband has POA.   Family Communication: Husband and niece at the bedside. Disposition Plan: Home when stable.  Time spent: One hour.  Suzann Lazaro [facility] Pager [phone] Cell: [phone]   If 7PM-7AM, please contact night-coverage [email or web address] Password [facility] 04/27/2014, 5:12 PM

## 2014-04-27 NOTE — ED Notes (Addendum)
Pt comes from Cancer center for syncopal episode x2 while sitting in room discussing with MD about treatment options.  Pt's CBG 159.  Pt was on Decadron taper that finished yesterday.  Pt has new cough x 4 days.  Pt has Brain and lung cancer, with whole brain radiation last week.  Pt was hypotensive at Cancer center and was given about 1L Normal Saline bolus.   Dr Benay Spice recommends stat head and chest CT.

## 2014-04-27 NOTE — Progress Notes (Addendum)
[facility] OFFICE PROGRESS NOTE   Diagnosis:    INTERVAL HISTORY:   Lori Park returns as scheduled. She completed whole brain radiation 04/19/2014. She underwent biopsy of the right lower lobe lung mass on 04/20/2014. 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  She is accompanied by her husband and niece. She completed the Decadron taper yesterday. She has had an intermittent cough over the past 5 days. She denies shortness of breath. No fever. She reports mild pain at the right lower leg. She denies any leg swelling. No chest pain.   During the course of our discussion today she developed a decreased level of consciousness, was noted to be pale and clammy. Blood pressure was 66/44, heart rate 55. Blood sugar returned at 129. Peripheral IV was started and normal saline initiated. She was given Decadron 10 mg IV. The level of consciousness subsequently improved. She became alert and conversant. Following commands. Blood pressure improved to 108/67. The level of consciousness decreased again at which time the blood pressure was 59/41. She became nauseated. Oxygen saturation ranged from the visit 97-100% throughout the visit. The level of consciousness again improved. She was transported to the emergency department.  Objective:  Vital signs in last 24 hours:  Blood pressure 126/85, pulse 92, temperature 98.5 F (36.9 C), temperature source Oral, resp. rate 18, height 5\' 8"  (1.727 m), weight 163 lb 14.4 oz (74.345 kg), SpO2 100 %.    HEENT: Pupils equal round and reactive to light. Resp: Lungs clear bilaterally. Cardio: Regular rate and rhythm. GI: Abdomen soft and nontender. Vascular: No leg edema. Calves soft and nontender. Neuro: At time of transfer alert and following commands. Moving all extremities.  Skin: Clammy.    Lab Results:  Lab Results  Component Value Date   WBC 7.3 04/20/2014   HGB 13.8  04/20/2014   HCT 41.2 04/20/2014   MCV 100.0 04/20/2014   PLT 272 04/20/2014   NEUTROABS 5.8 03/30/2014    Imaging:  No results found.  Medications: I have reviewed the patient's current medications.  Assessment/Plan: 1. Right lower lung mass with possible lung metastases on a chest CT 03/29/2014  Bronchoscopy 03/30/2014-nondiagnostic tissue obtained  PET scan 04/15/2014 showed intracranial metastasis. Focus of hypermetabolism identified about the left medial parietal lobe. Superior segment right lower lobe mass with contact of the major fissure measuring 4.3 x 3.6 cm and SUV max of 8.9. No hypermetabolism within mediastinal or hilar nodes. Hypermetabolism identified about the low anus. SUV Max 8.3. No well-defined mass in this area.  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 2. Multiple brain metastases with vasogenic edema-palliative radiation started 04/04/2014 3. Gait ataxia and memory loss secondary to #2 4. Hyperlipidemia   Disposition: Lori Park has non-small cell lung cancer metastatic to brain. She completed the course of whole brain radiation on 04/19/2014. She completed a Decadron taper yesterday. She was seen today to discuss the biopsy result and treatment recommendations. During the course of today's visit she developed a decreased level of consciousness and hypotension. Dr. Benay Spice feels she likely has had a CNS event. She was given Decadron 10 mg IV in the office and transported to the emergency room. Dr. Benay Spice has spoken with the emergency room physician.  Patient was seen with Dr. Benay Spice at today's visit.  Ned Card ANP/GNP-BC   04/27/2014  1:38 PM  This was a shared visit with Ned Card.  Lori Park presented to the office today to discuss the pathology from the right lung biopsy and treatment options. She developed an acute  change in her metal status with associated hypotension. She became nauseated.  I am concerned she had a CNS event related to the brain metastases. She was given intravenous fluids and IV Decadron. The differential diagnosis includes a pulmonary embolism. She was transported to the emergency room for further care. I discussed the case with the emergency room physician.  We will consider systemic treatment options for the non-small cell lung cancer pending her recovery from the acute event. I discussed the situation with her family.   Julieanne Manson, M.D.

## 2014-04-27 NOTE — ED Notes (Signed)
Patient in CT

## 2014-04-28 ENCOUNTER — Telehealth: Payer: Self-pay | Admitting: Oncology

## 2014-04-28 ENCOUNTER — Other Ambulatory Visit: Payer: Self-pay

## 2014-04-28 DIAGNOSIS — C3491 Malignant neoplasm of unspecified part of right bronchus or lung: Secondary | ICD-10-CM

## 2014-04-28 DIAGNOSIS — R4182 Altered mental status, unspecified: Secondary | ICD-10-CM

## 2014-04-28 DIAGNOSIS — C7931 Secondary malignant neoplasm of brain: Secondary | ICD-10-CM

## 2014-04-28 DIAGNOSIS — C3431 Malignant neoplasm of lower lobe, right bronchus or lung: Secondary | ICD-10-CM

## 2014-04-28 MED ORDER — DEXAMETHASONE 4 MG PO TABS
4.0000 mg | ORAL_TABLET | Freq: Two times a day (BID) | ORAL | Status: DC
Start: 2014-04-28 — End: 2014-05-19

## 2014-04-28 NOTE — Telephone Encounter (Signed)
lvm forpt regarding to 3.3 appt.Marland KitchenMarland KitchenMarland Kitchen

## 2014-04-28 NOTE — Discharge Summary (Signed)
Physician Discharge Summary  Lori Park LYY:[id number] DOB: 10-14-1945 DOA: 04/27/2014  PCP: Shirline Frees, MD  Admit date: 04/27/2014 Discharge date: 04/28/2014   Recommendations for Outpatient Follow-Up:   1. The patient will F/U with Dr. Benay Spice for further treatment recommendations and instructions on how to taper Decadron.   Discharge Diagnosis:   Principal Problem:    Syncope Active Problems:    Brain metastases    Non-small cell lung cancer, stage IV with brain metastasis    Hypotension    Hyperlipidemia    Gait abnormality   Discharge Condition: Improved.  Diet recommendation:  Regular.   History of Present Illness:   Lori Park is an 69 y.o. female with a PMH of recently diagnosed stage IV poorly differentiated NSLC with brain metastasis s/p whole brain radiation (completed 04/20/14) and tapering of steroids, who presented to her oncologist's office 04/27/14 for discussion of further treatment options, and during the discussion, had a syncopal episode at approximately 1 pm at [facility] office.  Hospital Course by Problem:   Principal Problem:  Syncope / hypotension  Differential is a vasovagal reaction to receiving bad news versus hypotension in the setting of steroid withdrawal.  No arrhythmic events noted on telemetry.  Resume Decadron, 4 mg twice a day with slow taper.  Gently hydrated overnight.  CT angiogram negative for pulmonary embolism.  Active Problems:  Brain metastases / gait abnormality  Patient has completed whole brain radiation.  Decadron resumed.  Patient back to baseline status the following day.   Non-small cell lung cancer, stage IV with brain metastasis  Dr. Benay Spice aware of the patient's admission and will see her in follow-up.   Hyperlipidemia  Continue statin, omega-3 fatty acids.    Medical Consultants:    Dr. Julieanne Manson, Oncology.   Discharge Exam:   Filed Vitals:   04/28/14  0515  BP: 140/89  Pulse: 74  Temp: 97.9 F (36.6 C)  Resp: 18   Filed Vitals:   04/27/14 1744 04/27/14 1755 04/27/14 2107 04/28/14 0515  BP:  147/89 131/88 140/89  Pulse:  86 85 74  Temp:  97.5 F (36.4 C) 97.6 F (36.4 C) 97.9 F (36.6 C)  TempSrc:  Oral Oral Oral  Resp:  20 20 18   Height: 5\' 8"  (1.727 m)     Weight: 74.345 kg (163 lb 14.4 oz)     SpO2:  99% 93% 100%    Gen:  NAD Cardiovascular:  RRR, No M/R/G Respiratory: Lungs CTAB Gastrointestinal: Abdomen soft, NT/ND with normal active bowel sounds. Extremities: No C/E/C   The results of significant diagnostics from this hospitalization (including imaging, microbiology, ancillary and laboratory) are listed below for reference.     Procedures and Diagnostic Studies:      Labs:   Basic Metabolic Panel:  Recent Labs Lab 04/27/14 1428  NA 132*  K 4.0  CL 96  CO2 26  GLUCOSE 129*  BUN 14  CREATININE 0.72  CALCIUM 9.4   GFR Estimated Creatinine Clearance: 67 mL/min (by C-G formula based on Cr of 0.72). Liver Function Tests:  Recent Labs Lab 04/27/14 1428  AST 38*  ALT 62*  ALKPHOS 41  BILITOT 1.5*  PROT 6.3  ALBUMIN 4.0   CBC:  Recent Labs Lab 04/27/14 1428  WBC 7.4  NEUTROABS 6.3  HGB 13.4  HCT 40.5  MCV 102.5*  PLT 233   CBG:  Recent Labs Lab 04/27/14 1411  GLUCAP 110*     Discharge Instructions:   Discharge  Instructions    Call MD for:  extreme fatigue    Complete by:  As directed      Call MD for:  persistant dizziness or light-headedness    Complete by:  As directed      Call MD for:  persistant nausea and vomiting    Complete by:  As directed      Call MD for:  temperature >100.4    Complete by:  As directed      Diet general    Complete by:  As directed      Discharge instructions    Complete by:  As directed   You were cared for by Dr. Jacquelynn Cree  (a hospitalist) during your hospital stay. If you have any questions about your discharge medications or the  care you received while you were in the hospital after you are discharged, you can call the unit and ask to speak with the hospitalist on call if the hospitalist that took care of you is not available. Once you are discharged, your primary care physician will handle any further medical issues. Please note that NO REFILLS for any discharge medications will be authorized once you are discharged, as it is imperative that you return to your primary care physician (or establish a relationship with a primary care physician if you do not have one) for your aftercare needs so that they can reassess your need for medications and monitor your lab values.  Any outstanding tests can be reviewed by your PCP at your follow up visit.  It is also important to review any medicine changes with your PCP.  Please bring these d/c instructions with you to your next visit so your physician can review these changes with you.  If you do not have a primary care physician, you can call [phone] for a physician referral.  It is highly recommended that you obtain a PCP for hospital follow up.     Increase activity slowly    Complete by:  As directed             Medication List    STOP taking these medications        emollient cream  Commonly known as:  BIAFINE      TAKE these medications        aspirin EC 81 MG tablet  Take 81 mg by mouth at bedtime.     Calcium-Vitamin D 600-400 MG-UNIT Tabs  Take 600 mg by mouth 2 (two) times daily.     citalopram 20 MG tablet  Commonly known as:  CELEXA  Take 20 mg by mouth daily.     dexamethasone 4 MG tablet  Commonly known as:  DECADRON  Take 1 tablet (4 mg total) by mouth 2 (two) times daily.     Fish Oil 1000 MG Caps  Take 2,000 mg by mouth 2 (two) times daily.     ibuprofen 200 MG tablet  Commonly known as:  ADVIL,MOTRIN  Take 200-400 mg by mouth every 6 (six) hours as needed for moderate pain (pain).     lisinopril 10 MG tablet  Commonly known as:   PRINIVIL,ZESTRIL  Take 1 tablet (10 mg total) by mouth daily.     niacin 1000 MG CR tablet  Commonly known as:  NIASPAN  Take 1,000 mg by mouth at bedtime.     REFRESH OP  Place 2 drops into both eyes daily as needed (dry eyes).     simvastatin 20 MG  tablet  Commonly known as:  ZOCOR  Take 20 mg by mouth at bedtime.     Vitamin D3 2000 UNITS Tabs  Take 2,000 Units by mouth at bedtime.     vitamin E 400 UNIT capsule  Take 400 Units by mouth daily.           Follow-up Information    Follow up with Shirline Frees, MD.   Specialty:  Family Medicine   Why:  If symptoms worsen   Contact information:   [place] [address] [phone]       Follow up with Betsy Coder, MD.   Specialty:  Oncology   Why:  At your scheduled appointment time.   Contact information:   [place] [address] [phone]        Time coordinating discharge: 25 minutes.  Signed:  RAMA,CHRISTINA  Pager [phone] [facility] 04/28/2014, 3:22 PM

## 2014-04-28 NOTE — Progress Notes (Signed)
UR completed 

## 2014-04-28 NOTE — Discharge Instructions (Signed)
Near-Syncope Near-syncope (commonly known as near fainting) is sudden weakness, dizziness, or feeling like you might pass out. During an episode of near-syncope, you may also develop pale skin, have tunnel vision, or feel sick to your stomach (nauseous). Near-syncope may occur when getting up after sitting or while standing for a long time. It is caused by a sudden decrease in blood flow to the brain. This decrease can result from various causes or triggers, most of which are not serious. However, because near-syncope can sometimes be a sign of something serious, a medical evaluation is required. The specific cause is often not determined. HOME CARE INSTRUCTIONS  Monitor your condition for any changes. The following actions may help to alleviate any discomfort you are experiencing:  Have someone stay with you until you feel stable.  Lie down right away and prop your feet up if you start feeling like you might faint. Breathe deeply and steadily. Wait until all the symptoms have passed. Most of these episodes last only a few minutes. You may feel tired for several hours.   Drink enough fluids to keep your urine clear or pale yellow.   If you are taking blood pressure or heart medicine, get up slowly when seated or lying down. Take several minutes to sit and then stand. This can reduce dizziness.  Follow up with your health care provider as directed. SEEK IMMEDIATE MEDICAL CARE IF:   You have a severe headache.   You have unusual pain in the chest, abdomen, or back.   You are bleeding from the mouth or rectum, or you have black or tarry stool.   You have an irregular or very fast heartbeat.   You have repeated fainting or have seizure-like jerking during an episode.   You faint when sitting or lying down.   You have confusion.   You have difficulty walking.   You have severe weakness.   You have vision problems.  MAKE SURE YOU:   Understand these instructions.  Will  watch your condition.  Will get help right away if you are not doing well or get worse. Document Released: 02/18/2005 Document Revised: 02/23/2013 Document Reviewed: 07/24/2012 Exit[employer] Patient Information 2015 [employer]. This information is not intended to replace advice given to you by your health care provider. Make sure you discuss any questions you have with your health care provider.  

## 2014-04-28 NOTE — Progress Notes (Signed)
IP PROGRESS NOTE  Subjective:   No recurrent syncope. No specific complaint today.  Objective: Vital signs in last 24 hours: Blood pressure 140/89, pulse 74, temperature 97.9 F (36.6 C), temperature source Oral, resp. rate 18, height $RemoveBe'5\' 8"'okRItgXqv$  (1.727 m), weight 163 lb 14.4 oz (74.345 kg), SpO2 100 %.  Intake/Output from previous day: 02/24 0701 - 02/25 0700 In: 913.8 [I.V.:913.8] Out: -   Physical Exam:  Lungs: Clear bilaterally Cardiac: Regular rate and rhythm Abdomen: Soft and nontender Extremities: No leg edema Neurologic: Alert, follows commands, the motor exam appears intact in the upper and lower extremities bilaterally    Lab Results:  Recent Labs  04/27/14 1428  WBC 7.4  HGB 13.4  HCT 40.5  PLT 233    BMET  Recent Labs  04/27/14 1428  NA 132*  K 4.0  CL 96  CO2 26  GLUCOSE 129*  BUN 14  CREATININE 0.72  CALCIUM 9.4    Studies/Results: Dg Chest 2 View  04/27/2014   CLINICAL DATA:  69 year old female with dizziness and loss of consciousness. Right lower lobe lung mass. Initial encounter.  EXAM: CHEST  2 VIEW  COMPARISON:  04/20/2014 and earlier.  FINDINGS: Semi upright AP and lateral views of the chest. Radiographically stable superior segment right lower lobe masslike opacity. Chronic right middle lobe streaky opacity appears stable. No pneumothorax or pulmonary edema. No pleural effusion or new pulmonary opacity. Stable mediastinal contour. Visualized tracheal air column is within normal limits. No acute osseous abnormality identified.  IMPRESSION: Radiographically stable right lung mass. No new cardiopulmonary abnormality identified.   Electronically Signed   By: Genevie Ann M.D.   On: 04/27/2014 14:55   Ct Head Wo Contrast  04/27/2014   CLINICAL DATA:  Syncopal episodes known history of lung carcinoma with metastatic lesions in the brain.  EXAM: CT HEAD WITHOUT CONTRAST  TECHNIQUE: Contiguous axial images were obtained from the base of the skull through the  vertex without intravenous contrast.  COMPARISON:  03/28/2014  FINDINGS: Bony calvarium is intact. There again noted rounded hypodensities identified within the pons, cerebellum and cerebral hemispheres particularly on the left consistent with the known history of metastatic disease. Diffuse vasogenic edema in the left frontal lobe is again seen and stable. Mild chronic white matter ischemic changes. No findings to suggest acute hemorrhage or acute infarction are noted.  IMPRESSION: Multifocal metastatic disease not significantly changed from the prior CT examination.   Electronically Signed   By: Inez Catalina M.D.   On: 04/27/2014 16:25   Ct Angio Chest Pe W/cm &/or Wo Cm  04/27/2014   CLINICAL DATA:  Syncope.  Lung cancer.  EXAM: CT ANGIOGRAPHY CHEST WITH CONTRAST  TECHNIQUE: Multidetector CT imaging of the chest was performed using the standard protocol during bolus administration of intravenous contrast. Multiplanar CT image reconstructions and MIPs were obtained to evaluate the vascular anatomy.  CONTRAST:  186mL OMNIPAQUE IOHEXOL 350 MG/ML SOLN  COMPARISON:  PET-CT dated 04/15/2014.  CT chest dated 03/29/2014.  FINDINGS: No evidence of pulmonary embolism.  Mediastinum/Nodes: Cardiomegaly.  No pericardial effusion.  Coronary atherosclerosis in the LAD and right main coronary artery.  Atherosclerotic calcifications of the aortic arch.  5 mm short axis right paratracheal node (series 4/image 20). 10 mm short axis node in the right azygoesophageal recess (series 4/image 43). No appreciable hypermetabolism on prior PET.  Lungs/Pleura: 3.4 x 4.5 x 4.2 cm right lower lobe mass which abuts the right major fissure. Minimal adjacent satellite nodularity (series  8/ image 43).  Mild dependent atelectasis in the bilateral lower lobes.  Mild paraseptal emphysematous changes.  No pleural effusion or pneumothorax.  Upper abdomen: Visualized upper abdomen is unremarkable.  Musculoskeletal: Mild degenerative changes of the  thoracic spine.  Review of the MIP images confirms the above findings.  IMPRESSION: No evidence of pulmonary embolism.  4.5 cm right lower lobe mass which abuts the right major fissure, compatible with known primary bronchogenic neoplasm.  No evidence of metastatic disease in the chest.   Electronically Signed   By: Julian Hy M.D.   On: 04/27/2014 16:26    Medications: I have reviewed the patient's current medications.  Assessment/Plan: 1. Right lower lung mass with possible lung metastases on a chest CT 03/29/2014  Bronchoscopy 03/30/2014-nondiagnostic tissue obtained  PET scan 04/15/2014 showed intracranial metastasis. Focus of hypermetabolism identified about the left medial parietal lobe. Superior segment right lower lobe mass with contact of the major fissure measuring 4.3 x 3.6 cm and SUV max of 8.9. No hypermetabolism within mediastinal or hilar nodes. Hypermetabolism identified about the low anus. SUV Max 8.3. No well-defined mass in this area.  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 2. Multiple brain metastases with vasogenic edema-palliative radiation started 04/04/2014, completed 04/19/2014 3. Gait ataxia and memory loss secondary to #2 4. Hyperlipidemia 5. Altered level of consciousness/hypotension during an office visit to 24 2016-brain CT without acute changes, chest CT negative for pulmonary embolism   Her mental status appears at baseline today. No new focal neurologic deficit. The etiology of the event yesterday remains unclear. I suspect she had a vasovagal event or altered consciousness related to being tapered off of Decadron.  I discussed the diagnosis of metastatic non-small cell lung cancer and treatment options with Ms. Kernes and her husband. She has been treated for brain metastases. We discussed systemic therapy versus supportive care. I  recommend a trial of Alimta/carboplatin chemotherapy if the ALK/EGFR testing returns negative. We reviewed the potential toxicities associated with this regimen including the chance for nausea/vomiting, mucositis, diarrhea, alopecia, and allergic reaction, and hematologic toxicity.  She will return to the office for further discussion and to attend a chemotherapy teaching class next week.   Recommendations: 1. Continue Decadron 4 mg twice daily , to be tapered as an outpatient 2. Follow-up at the [facility] next week  LOS: 1 day   Rodriguez Camp  04/28/2014, 8:27 AM

## 2014-05-03 ENCOUNTER — Encounter (HOSPITAL_COMMUNITY): Payer: Self-pay

## 2014-05-05 ENCOUNTER — Encounter: Payer: Self-pay | Admitting: *Deleted

## 2014-05-05 ENCOUNTER — Telehealth: Payer: Self-pay | Admitting: *Deleted

## 2014-05-05 ENCOUNTER — Telehealth: Payer: Self-pay | Admitting: Oncology

## 2014-05-05 ENCOUNTER — Ambulatory Visit (HOSPITAL_BASED_OUTPATIENT_CLINIC_OR_DEPARTMENT_OTHER): Payer: PPO | Admitting: Nurse Practitioner

## 2014-05-05 ENCOUNTER — Encounter (HOSPITAL_COMMUNITY): Payer: Self-pay

## 2014-05-05 ENCOUNTER — Other Ambulatory Visit: Payer: PPO

## 2014-05-05 VITALS — BP 150/78 | HR 67 | Temp 97.9°F | Resp 18 | Ht 68.0 in | Wt 161.2 lb

## 2014-05-05 DIAGNOSIS — C3490 Malignant neoplasm of unspecified part of unspecified bronchus or lung: Secondary | ICD-10-CM

## 2014-05-05 DIAGNOSIS — R269 Unspecified abnormalities of gait and mobility: Secondary | ICD-10-CM

## 2014-05-05 DIAGNOSIS — R2689 Other abnormalities of gait and mobility: Secondary | ICD-10-CM

## 2014-05-05 MED ORDER — CYANOCOBALAMIN 1000 MCG/ML IJ SOLN
1000.0000 ug | Freq: Once | INTRAMUSCULAR | Status: AC
  Administered 2014-05-05: 1000 ug via INTRAMUSCULAR

## 2014-05-05 MED ORDER — LIDOCAINE-PRILOCAINE 2.5-2.5 % EX CREA: TOPICAL_CREAM | CUTANEOUS | Status: AC

## 2014-05-05 MED ORDER — FOLIC ACID 1 MG PO TABS
1.0000 mg | ORAL_TABLET | Freq: Every day | ORAL | Status: DC
Start: 2014-05-05 — End: 2014-06-16

## 2014-05-05 NOTE — Progress Notes (Addendum)
[facility] OFFICE PROGRESS NOTE   Diagnosis:  Non-small cell lung cancer  INTERVAL HISTORY:   Ms. Obando returns as scheduled. No further syncopal episodes. She continues to have a gait disturbance. Her family feels that her legs are weak. No nausea or vomiting. No headaches. Stable dyspnea on exertion. Intermittent cough, productive at times and mainly occurring at bedtime.  Objective:  Vital signs in last 24 hours:  Blood pressure 150/78, pulse 67, temperature 97.9 F (36.6 C), temperature source Oral, resp. rate 18, height _0  (1.727 m), weight 161 lb 3.2 oz (73.12 kg).    HEENT: No thrush or ulcers. Resp: Lungs clear bilaterally. Cardio: Regular rate and rhythm. GI: Abdomen soft and nontender. No hepatomegaly. Vascular: No leg edema. Calves soft and nontender. Neuro: Alert and oriented. Ataxic gait. Mild proximal leg weakness. Skin: No rash.    Lab Results:  Lab Results  Component Value Date   WBC 7.4 04/27/2014   HGB 13.4 04/27/2014   HCT 40.5 04/27/2014   MCV 102.5* 04/27/2014   PLT 233 04/27/2014   NEUTROABS 6.3 04/27/2014    Imaging:  No results found.  Medications: I have reviewed the patient's current medications.  Assessment/Plan: 1. Right lower lung mass with possible lung metastases on a chest CT 03/29/2014  Bronchoscopy 03/30/2014-nondiagnostic tissue obtained  PET scan 04/15/2014 showed intracranial metastasis. Focus of hypermetabolism identified about the left medial parietal lobe. Superior segment right lower lobe mass with contact of the major fissure measuring 4.3 x 3.6 cm and SUV max of 8.9. No hypermetabolism within mediastinal or hilar nodes. Hypermetabolism identified about the low anus. SUV Max 8.3. No well-defined mass in this area.  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  ALK gene rearrangement  not detected. 2. Multiple brain metastases with vasogenic edema-palliative radiation started 04/04/2014 and completed 04/19/2014. 3. Gait ataxia and memory loss secondary to #2 4. Hyperlipidemia 5. Altered level of consciousness/hypotension during an office visit 04/27/2014. Brain CT without acute changes. Chest CT negative for pulmonary embolism. Question vasovagal event or related to being tapered off of Decadron.    Disposition: Ms. Mago has had no further syncopal events. We tapered the Decadron to 4 mg every morning and 2 mg every afternoon beginning today. She will continue this dose until she returns for the next visit in 2 weeks.  The proximal leg weakness is likely a combination of deconditioning and steroid myopathy. We will make a referral for physical and occupational therapy.  She has decided to proceed with a trial of carboplatin/Alimta on a 3 week schedule. Potential toxicities have been reviewed. She has attended the chemotherapy education class. She will receive an initial B-12 injection today and begin folic acid 1 milligram daily. We recommended a Port-A-Cath. She is in agreement. We made a referral to interventional radiology at [facility].  She would like to begin the first cycle of carboplatin/Alimta on 05/19/2014. We will see her in follow-up prior to treatment that day. She will contact the office in the interim with any problems.  Of note, EGFR testing is still pending and may affect the above outlined treatment plan. We will contact her once the EGFR result is available.  Patient seen with Dr. Benay Spice. 25 minutes were spent face-to-face at today's visit with the majority of that time involved in counseling/coordination of care.    Ned Card ANP/GNP-BC   05/05/2014  11:34 AM  This was  a shared visit with Ned Card. I discussed treatment options with Ms. Sharia Reeve and her family. We will follow-up on the EGFR testing. If negative the plan is to proceed  with Alimta/carboplatin. We will consider her eligibility for a "maintenance "trial pending the response to Alimta/carboplatin.  Julieanne Manson, M.D.

## 2014-05-05 NOTE — Telephone Encounter (Signed)
gv and printed appt sched and avs for pt for march and April....sed added tx.

## 2014-05-05 NOTE — Progress Notes (Signed)
Pt husband called to verify prescriptions today. Concerned about lidocaine cream for port and pt does not have a port yet. Discussed with pt husband that this cream is for pt to apply over her port prior to appts to decrease the discomfort when the port is accessed. Pt verbalized an understanding.

## 2014-05-05 NOTE — Telephone Encounter (Signed)
[place] with [facility] and left message to see if she would be willing to visit home for PT (referral has been put in) Pt family has requested [facility].

## 2014-05-09 ENCOUNTER — Telehealth: Payer: Self-pay | Admitting: *Deleted

## 2014-05-09 NOTE — Telephone Encounter (Signed)
Spoke with pt, informed her that EGFR testing is negative. Plan for Alimta/Carbo as scheduled. She voiced understanding. Informed pt of 3/9 port placement appointment. She knows to expect call from radiology dept with instructions for procedure.

## 2014-05-09 NOTE — Telephone Encounter (Signed)
-----  Message from Ladell Pier, MD sent at 05/08/2014  7:36 PM EST ----- Please call patient, EGFR mutation is negative Plan for Alimta/Carboplatin as scheduled

## 2014-05-10 ENCOUNTER — Other Ambulatory Visit: Payer: Self-pay | Admitting: Radiology

## 2014-05-11 ENCOUNTER — Ambulatory Visit (HOSPITAL_COMMUNITY)
Admission: RE | Admit: 2014-05-11 | Discharge: 2014-05-11 | Disposition: A | Discharge status: Home / Self Care | Payer: PPO | Source: Ambulatory Visit | Attending: Oncology | Admitting: Oncology

## 2014-05-11 ENCOUNTER — Ambulatory Visit (HOSPITAL_COMMUNITY)
Admission: RE | Admit: 2014-05-11 | Discharge: 2014-05-11 | Disposition: A | Discharge status: Home / Self Care | Payer: PPO | Source: Ambulatory Visit | Attending: Interventional Radiology | Admitting: Interventional Radiology

## 2014-05-11 ENCOUNTER — Other Ambulatory Visit: Payer: Self-pay | Admitting: Nurse Practitioner

## 2014-05-11 ENCOUNTER — Encounter (HOSPITAL_COMMUNITY): Payer: Self-pay

## 2014-05-11 DIAGNOSIS — Z79899 Other long term (current) drug therapy: Secondary | ICD-10-CM | POA: Insufficient documentation

## 2014-05-11 DIAGNOSIS — C3431 Malignant neoplasm of lower lobe, right bronchus or lung: Secondary | ICD-10-CM | POA: Diagnosis present

## 2014-05-11 DIAGNOSIS — Z87891 Personal history of nicotine dependence: Secondary | ICD-10-CM | POA: Insufficient documentation

## 2014-05-11 DIAGNOSIS — C7931 Secondary malignant neoplasm of brain: Secondary | ICD-10-CM | POA: Insufficient documentation

## 2014-05-11 DIAGNOSIS — E780 Pure hypercholesterolemia: Secondary | ICD-10-CM | POA: Insufficient documentation

## 2014-05-11 DIAGNOSIS — Z7982 Long term (current) use of aspirin: Secondary | ICD-10-CM | POA: Diagnosis not present

## 2014-05-11 DIAGNOSIS — I10 Essential (primary) hypertension: Secondary | ICD-10-CM | POA: Insufficient documentation

## 2014-05-11 DIAGNOSIS — C3490 Malignant neoplasm of unspecified part of unspecified bronchus or lung: Secondary | ICD-10-CM

## 2014-05-11 DIAGNOSIS — H409 Unspecified glaucoma: Secondary | ICD-10-CM | POA: Diagnosis not present

## 2014-05-11 LAB — PROTIME-INR
INR: 0.9 (ref 0.00–1.49)
Prothrombin Time: 12.3 seconds (ref 11.6–15.2)

## 2014-05-11 LAB — CBC WITH DIFFERENTIAL/PLATELET
BASOS ABS: 0 10*3/uL (ref 0.0–0.1)
Basophils Relative: 0 % (ref 0–1)
EOS ABS: 0 10*3/uL (ref 0.0–0.7)
EOS PCT: 0 % (ref 0–5)
HEMATOCRIT: 41.3 % (ref 36.0–46.0)
Hemoglobin: 13.8 g/dL (ref 12.0–15.0)
Lymphocytes Relative: 13 % (ref 12–46)
Lymphs Abs: 1.1 10*3/uL (ref 0.7–4.0)
MCH: 34.1 pg — ABNORMAL HIGH (ref 26.0–34.0)
MCHC: 33.4 g/dL (ref 30.0–36.0)
MCV: 102 fL — ABNORMAL HIGH (ref 78.0–100.0)
Monocytes Absolute: 0.9 10*3/uL (ref 0.1–1.0)
Monocytes Relative: 11 % (ref 3–12)
NEUTROS ABS: 6.4 10*3/uL (ref 1.7–7.7)
Neutrophils Relative %: 76 % (ref 43–77)
PLATELETS: 443 10*3/uL — AB (ref 150–400)
RBC: 4.05 MIL/uL (ref 3.87–5.11)
RDW: 13.3 % (ref 11.5–15.5)
WBC: 8.3 10*3/uL (ref 4.0–10.5)

## 2014-05-11 LAB — APTT: aPTT: 25 seconds (ref 24–37)

## 2014-05-11 MED ORDER — CEFAZOLIN SODIUM-DEXTROSE 2-3 GM-% IV SOLR
INTRAVENOUS | Status: AC
  Filled 2014-05-11: qty 50

## 2014-05-11 MED ORDER — FENTANYL CITRATE 0.05 MG/ML IJ SOLN
INTRAMUSCULAR | Status: AC
  Filled 2014-05-11: qty 4

## 2014-05-11 MED ORDER — MIDAZOLAM HCL 2 MG/2ML IJ SOLN
INTRAMUSCULAR | Status: AC | PRN
  Administered 2014-05-11 (×2): 0.5 mg via INTRAVENOUS
  Administered 2014-05-11: 1 mg via INTRAVENOUS

## 2014-05-11 MED ORDER — CEFAZOLIN SODIUM-DEXTROSE 2-3 GM-% IV SOLR
2.0000 g | Freq: Once | INTRAVENOUS | Status: AC
  Administered 2014-05-11: 2 g via INTRAVENOUS

## 2014-05-11 MED ORDER — SODIUM CHLORIDE 0.9 % IV SOLN
INTRAVENOUS | Status: DC
  Administered 2014-05-11: 10:00:00 via INTRAVENOUS

## 2014-05-11 MED ORDER — FENTANYL CITRATE 0.05 MG/ML IJ SOLN
INTRAMUSCULAR | Status: AC | PRN
  Administered 2014-05-11 (×2): 25 ug via INTRAVENOUS
  Administered 2014-05-11: 50 ug via INTRAVENOUS

## 2014-05-11 MED ORDER — MIDAZOLAM HCL 2 MG/2ML IJ SOLN
INTRAMUSCULAR | Status: AC
Start: 2014-05-11 — End: 2014-05-11
  Filled 2014-05-11: qty 6

## 2014-05-11 MED ORDER — HEPARIN SOD (PORK) LOCK FLUSH 100 UNIT/ML IV SOLN
INTRAVENOUS | Status: AC
  Filled 2014-05-11: qty 5

## 2014-05-11 MED ORDER — LIDOCAINE HCL 1 % IJ SOLN
INTRAMUSCULAR | Status: AC
  Filled 2014-05-11: qty 20

## 2014-05-11 MED ORDER — HEPARIN SOD (PORK) LOCK FLUSH 100 UNIT/ML IV SOLN
INTRAVENOUS | Status: AC | PRN
  Administered 2014-05-11: 500 [IU]

## 2014-05-11 NOTE — H&P (Signed)
Chief Complaint: "I'm here to get a port a cath"  Referring Physician(s): Harris,William/B Sherrill  History of Present Illness: Lori Park is a 69 y.o. female with history of metastatic [place] lung cancer who presents today for port a cath placement for chemotherapy.  Past Medical History  Diagnosis Date  . Hypertension   . High cholesterol   . 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 Glaucoma     Past Surgical History  Procedure Laterality Date  . Video bronchoscopy Bilateral 03/30/2014    Procedure: VIDEO BRONCHOSCOPY WITH FLUORO;  Surgeon: Rush Farmer, MD;  Location: [facility];  Service: Cardiopulmonary;  Laterality: Bilateral;  . Hernia repair    . Breast surgery  40 years ago    bilateral at different times    Allergies: Nickel  Medications: Prior to Admission medications   Medication Sig Start Date End Date Taking? Authorizing Provider  aspirin EC 81 MG tablet Take 81 mg by mouth at bedtime.   Yes Historical Provider, MD  BIOTIN PO Take 1 each by mouth 3 (three) times daily. To brush teeth.   Yes Historical Provider, MD  Calcium Carb-Cholecalciferol (CALCIUM-VITAMIN D) 600-400 MG-UNIT TABS Take 600 mg by mouth 2 (two) times daily.    Yes Historical Provider, MD  Cholecalciferol (VITAMIN D3) 2000 UNITS TABS Take 2,000 Units by mouth at bedtime.    Yes Historical Provider, MD  citalopram (CELEXA) 20 MG tablet Take 20 mg by mouth every morning.    Yes Historical Provider, MD  dexamethasone (DECADRON) 4 MG tablet Take 1 tablet (4 mg total) by mouth 2 (two) times daily. Patient taking differently: Take 2-4 mg by mouth 2 (two) times daily. Takes one full tablet in the morning and then half a tablet in the evening for the next 7-8 days. 04/28/14  Yes Venetia Maxon Rama, MD  folic acid (FOLVITE) 1 MG tablet Take 1 tablet (1 mg total) by mouth daily. Patient taking differently: Take 1 mg by mouth every morning.  05/05/14  Yes Owens Shark, NP    ibuprofen (ADVIL,MOTRIN) 200 MG tablet Take 200-400 mg by mouth every 6 (six) hours as needed for moderate pain (pain).    Yes Historical Provider, MD  lisinopril (PRINIVIL,ZESTRIL) 10 MG tablet Take 1 tablet (10 mg total) by mouth daily. Patient taking differently: Take 10 mg by mouth every morning.  03/31/14  Yes Costin Karlyne Greenspan, MD  niacin (NIASPAN) 1000 MG CR tablet Take 1,000 mg by mouth at bedtime.   Yes Historical Provider, MD  Omega-3 Fatty Acids (FISH OIL) 1000 MG CAPS Take 2,000 mg by mouth 2 (two) times daily.   Yes Historical Provider, MD  Polyvinyl Alcohol-Povidone (REFRESH OP) Place 2 drops into both eyes daily as needed (dry eyes).   Yes Historical Provider, MD  simvastatin (ZOCOR) 20 MG tablet Take 20 mg by mouth at bedtime.  03/16/14  Yes Historical Provider, MD  vitamin E 400 UNIT capsule Take 400 Units by mouth every morning.    Yes Historical Provider, MD  lidocaine-prilocaine (EMLA) cream Apply to portacath one hour prior to use 05/05/14   Owens Shark, NP  Skin Protectants, Misc. (EUCERIN) cream Apply 1 application topically 2 (two) times daily as needed for dry skin.    Historical Provider, MD    Family History  Problem Relation Age of Onset  . Heart failure Mother     Died age 75  . COPD Mother   .  Cancer - Colon Father     Died age 86  . Glaucoma Father   . COPD Sister   . COPD Brother     History   Social History  . Marital Status: Married    Spouse Name: Herbie Baltimore  . Number of Children: 1  . Years of Education: N/A   Occupational History  . Retired from [employer] (medical)    Social History Main Topics  . Smoking status: Former Smoker -- 1.00 packs/day for 30 years    Types: Cigarettes    Quit date: 03/29/2004  . Smokeless tobacco: Not on file  . Alcohol Use: 0.0 oz/week    0 Standard drinks or equivalent per week     Comment: Occasional, up to 2 drinks a few times a wek.  . Drug Use: No  . Sexual Activity: Not on file   Other Topics Concern  . None    Social History Narrative   Married.  Ambulates with a cane.        Review of Systems  Constitutional: Negative for fever and chills.  Respiratory: Negative for cough and shortness of breath.   Cardiovascular: Negative for chest pain.  Gastrointestinal: Negative for nausea, vomiting, abdominal pain and blood in stool.  Genitourinary: Negative for dysuria and hematuria.  Musculoskeletal: Negative for back pain.  Neurological: Positive for weakness and headaches.       Gait instability  Hematological: Does not bruise/bleed easily.    Vital Signs: BP 130/83 mmHg  Pulse 71  Temp(Src) 97.5 F (36.4 C) (Oral)  Resp 18  Ht 5\' 8"  (1.727 m)  Wt 161 lb (73.029 kg)  BMI 24.49 kg/m2  SpO2 100%  Physical Exam  Constitutional: She is oriented to person, place, and time. She appears well-developed and well-nourished.  Cardiovascular: Normal rate and regular rhythm.   Pulmonary/Chest: Effort normal and breath sounds normal.  Abdominal: Soft. Bowel sounds are normal. There is no tenderness.  Musculoskeletal: She exhibits no edema.  Mild prox leg weakness  Neurological: She is alert and oriented to person, place, and time.    Imaging: Dg Chest 1 View  04/20/2014   CLINICAL DATA:  Status post lung biopsy  EXAM: CHEST  1 VIEW  COMPARISON:  Chest CT March 29, 2014  FINDINGS: There is no demonstrable pneumothorax. The mass in the superior segment right lower lobe is again noted. Elsewhere the lungs are clear. Heart size and pulmonary vascularity are normal. No adenopathy. No bone lesions.  IMPRESSION: No pneumothorax. Mass superior segment right lower lobe persists. No new lesion.   Electronically Signed   By: Lowella Grip III M.D.   On: 04/20/2014 14:08   Dg Chest 2 View  04/27/2014   CLINICAL DATA:  69 year old female with dizziness and loss of consciousness. Right lower lobe lung mass. Initial encounter.  EXAM: CHEST  2 VIEW  COMPARISON:  04/20/2014 and earlier.  FINDINGS: Semi  upright AP and lateral views of the chest. Radiographically stable superior segment right lower lobe masslike opacity. Chronic right middle lobe streaky opacity appears stable. No pneumothorax or pulmonary edema. No pleural effusion or new pulmonary opacity. Stable mediastinal contour. Visualized tracheal air column is within normal limits. No acute osseous abnormality identified.  IMPRESSION: Radiographically stable right lung mass. No new cardiopulmonary abnormality identified.   Electronically Signed   By: Genevie Ann M.D.   On: 04/27/2014 14:55   Ct Head Wo Contrast  04/27/2014   CLINICAL DATA:  Syncopal episodes known history of lung carcinoma  with metastatic lesions in the brain.  EXAM: CT HEAD WITHOUT CONTRAST  TECHNIQUE: Contiguous axial images were obtained from the base of the skull through the vertex without intravenous contrast.  COMPARISON:  03/28/2014  FINDINGS: Bony calvarium is intact. There again noted rounded hypodensities identified within the pons, cerebellum and cerebral hemispheres particularly on the left consistent with the known history of metastatic disease. Diffuse vasogenic edema in the left frontal lobe is again seen and stable. Mild chronic white matter ischemic changes. No findings to suggest acute hemorrhage or acute infarction are noted.  IMPRESSION: Multifocal metastatic disease not significantly changed from the prior CT examination.   Electronically Signed   By: Inez Catalina M.D.   On: 04/27/2014 16:25   Ct Angio Chest Pe W/cm &/or Wo Cm  04/27/2014   CLINICAL DATA:  Syncope.  Lung cancer.  EXAM: CT ANGIOGRAPHY CHEST WITH CONTRAST  TECHNIQUE: Multidetector CT imaging of the chest was performed using the standard protocol during bolus administration of intravenous contrast. Multiplanar CT image reconstructions and MIPs were obtained to evaluate the vascular anatomy.  CONTRAST:  120mL OMNIPAQUE IOHEXOL 350 MG/ML SOLN  COMPARISON:  PET-CT dated 04/15/2014.  CT chest dated  03/29/2014.  FINDINGS: No evidence of pulmonary embolism.  Mediastinum/Nodes: Cardiomegaly.  No pericardial effusion.  Coronary atherosclerosis in the LAD and right main coronary artery.  Atherosclerotic calcifications of the aortic arch.  5 mm short axis right paratracheal node (series 4/image 20). 10 mm short axis node in the right azygoesophageal recess (series 4/image 43). No appreciable hypermetabolism on prior PET.  Lungs/Pleura: 3.4 x 4.5 x 4.2 cm right lower lobe mass which abuts the right major fissure. Minimal adjacent satellite nodularity (series 8/ image 43).  Mild dependent atelectasis in the bilateral lower lobes.  Mild paraseptal emphysematous changes.  No pleural effusion or pneumothorax.  Upper abdomen: Visualized upper abdomen is unremarkable.  Musculoskeletal: Mild degenerative changes of the thoracic spine.  Review of the MIP images confirms the above findings.  IMPRESSION: No evidence of pulmonary embolism.  4.5 cm right lower lobe mass which abuts the right major fissure, compatible with known primary bronchogenic neoplasm.  No evidence of metastatic disease in the chest.   Electronically Signed   By: Julian Hy M.D.   On: 04/27/2014 16:26   Nm Pet Image Initial (pi) Skull Base To Thigh  04/15/2014   CLINICAL DATA:  Initial treatment strategy for lung cancer with brain metastasis.  EXAM: NUCLEAR MEDICINE PET SKULL BASE TO THIGH  TECHNIQUE: 8.6 mCi F-18 FDG was injected intravenously. Full-ring PET imaging was performed from the skull base to thigh after the radiotracer. CT data was obtained and used for attenuation correction and anatomic localization.  FASTING BLOOD GLUCOSE:  Value: 130 mg/dl  COMPARISON:  Chest abdomen and pelvic CTs of 03/29/2014. Brain MR 03/28/2014.  FINDINGS: NECK  Intracranial metastasis. Focus of hypermetabolism is identified about the left medial parietal lobe.  No evidence of hypermetabolic cervical metastasis.  CHEST  Superior segment right lower lobe lung  mass with contact of the right major fissure. This measures 4.3 x 3.6 cm and a S.U.V. max of 8.9 on image 35.  No hypermetabolism within mediastinal or hilar nodes.  ABDOMEN/PELVIS  Hypermetabolism identified about the low anus. No well-defined mass in this area. This measures a S.U.V. max of 8.3.  SKELETON  Right greater trochanteric bursitis.  No suspicious osseous lesion.  CT IMAGES PERFORMED FOR ATTENUATION CORRECTION  No cervical adenopathy. Chest, abdomen, and pelvic findings  deferred a prior diagnostic CTs. Multivessel coronary artery atherosclerosis. Mild cardiomegaly. Smaller pulmonary nodules are identified bilaterally. These are somewhat ill-defined and ground-glass in morphology. Nonspecific.  Normal adrenal glands. Moderate hepatic steatosis. Extensive colonic diverticulosis. Uterine fibroids.  IMPRESSION: 1. Superior segment right lower lobe lung mass, consistent with primary bronchogenic carcinoma. 2. 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 3. Otherwise, no evidence of metastatic disease. 4. Anal hypermetabolism which is indeterminate and may be physiologic. Consider physical exam correlation. 5. Incidental findings, including Coronary artery atherosclerosis, hepatic steatosis, and uterine fibroids.   Electronically Signed   By: Abigail Miyamoto M.D.   On: 04/15/2014 15:41   Ct Biopsy  04/20/2014   CLINICAL DATA:  69 year old female with hypermetabolic mass in the superior segment of the right lower lobe concerning for primary bronchogenic carcinoma. CT-guided biopsy is warranted for tissue diagnosis.  EXAM: CT BIOPSY  Date: 04/20/2014  PROCEDURE: 1. CT-guided lung biopsy Interventional Radiologist:  Criselda Peaches, MD  ANESTHESIA/SEDATION: Moderate (conscious) sedation was used. 2 mg Versed, 50 mcg Fentanyl were administered intravenously. The patient's vital signs were monitored continuously by radiology nursing throughout the procedure.  Sedation Time: 20 minutes  MEDICATIONS:  None additional  TECHNIQUE: Informed consent was obtained from the patient following explanation of the procedure, risks, benefits and alternatives. The patient understands, agrees and consents for the procedure. All questions were addressed. A time out was performed.  A planning axial CT scan was performed. The mass in the superior segment of the right lower lobe was identified. A suitable skin entry site was selected and marked. That region of the chest was then sterilely prepped and draped in standard fashion using Betadine skin prep. Local anesthesia was attained by infiltration with 1% lidocaine. Lidocaine was also infiltrated at the pleural margin creating a subpleural wheal. A small dermatotomy was made. Using intermittent CT fluoroscopic guidance, a 17 gauge trocar needle was advanced into the margin of the mass. Multiple 18 gauge core biopsies were then coaxially obtained using the BioPince automated biopsy device. Biopsy specimens were placed in formalin and delivered to pathology for further analysis.  A bio centri device was then deployed. Post biopsy axial CT imaging demonstrates no evidence of immediate postprocedure pneumothorax, hemothorax or alveolar hemorrhage. The patient tolerated the procedure well.  COMPLICATIONS: None  IMPRESSION: Technically successful CT-guided biopsy of mass in the superior segment of the right lower lobe.  Signed,  Criselda Peaches, MD  [facility]  [facility]   Electronically Signed   By: Jacqulynn Cadet M.D.   On: 04/20/2014 12:32    Labs:  CBC:  Recent Labs  03/28/14 1900 03/30/14 0447 04/20/14 0900 04/27/14 1428  WBC 6.3 7.3 7.3 7.4  HGB 13.5 13.0 13.8 13.4  HCT 40.5 39.0 41.2 40.5  PLT 349 359 272 233    COAGS:  Recent Labs  03/28/14 1900 03/30/14 0447 04/20/14 0900  INR 0.99 1.01 0.96  APTT 28  --  22*    BMP:  Recent Labs  03/28/14 1900 03/30/14 0447 04/27/14 1428  NA 137 138  132*  K 3.6 4.5 4.0  CL 99 105 96  CO2 27 20 26   GLUCOSE 119* 152* 129*  BUN 9 11 14   CALCIUM 10.1 9.3 9.4  CREATININE 0.80 0.83 0.72  GFRNONAA 74* 70* 86*  GFRAA 85* 82* >90    LIVER FUNCTION TESTS:  Recent Labs  03/28/14 1900 03/30/14 0447 04/27/14 1428  BILITOT 1.0 1.1 1.5*  AST  33 27 38*  ALT 29 23 62*  ALKPHOS 49 37* 41  PROT 7.7 6.8 6.3  ALBUMIN 4.7 4.0 4.0    TUMOR MARKERS: No results for input(s): AFPTM, CEA, CA199, CHROMGRNA in the last 8760 hours.  Assessment and Plan: Lori Park is a 69 y.o. female with history of metastatic [place] lung cancer who presents today for port a cath placement for chemotherapy.Risks and benefits discussed with the patient/husband including, but not limited to bleeding, infection, pneumothorax, or fibrin sheath development and need for additional procedures. All of the patient's questions were answered, patient is agreeable to proceed. Consent signed and in chart.       Signed: Autumn Messing 05/11/2014, 10:21 AM   I spent a total of 20 minutes face to face in clinical consultation, greater than 50% of which was counseling/coordinating care for port a cath placement.

## 2014-05-11 NOTE — Procedures (Signed)
Interventional Radiology Procedure Note  Procedure: Placement of a right IJ approach single lumen PowerPort.  Tip is positioned at the superior cavoatrial junction and catheter is ready for immediate use.  Complications: No immediate. EBL < 25 mL Recommendations:  - Ok to shower tomorrow - Do not submerge for 7 days - Routine line care   Lori Park T. Kathlene Cote, M.D. Pager:  [phone]

## 2014-05-11 NOTE — Discharge Instructions (Signed)
Implanted Port Insertion, Care After °Refer to this sheet in the next few weeks. These instructions provide you with information on caring for yourself after your procedure. Your health care provider may also give you more specific instructions. Your treatment has been planned according to current medical practices, but problems sometimes occur. Call your health care provider if you have any problems or questions after your procedure. °WHAT TO EXPECT AFTER THE PROCEDURE °After your procedure, it is typical to have the following:  °· Discomfort at the port insertion site. Ice packs to the area will help. °· Bruising on the skin over the port. This will subside in 3-4 days. °HOME CARE INSTRUCTIONS °· After your port is placed, you will get a manufacturer's information card. The card has information about your port. Keep this card with you at all times.   °· Know what kind of port you have. There are many types of ports available.   °· Wear a medical alert bracelet in case of an emergency. This can help alert health care workers that you have a port.   °· The port can stay in for as long as your health care provider believes it is necessary.   °· A home health care nurse may give medicines and take care of the port.   °· You or a family member can get special training and directions for giving medicine and taking care of the port at home.   °SEEK MEDICAL CARE IF:  °· Your port does not flush or you are unable to get a blood return.   °· You have a fever or chills. °SEEK IMMEDIATE MEDICAL CARE IF: °· You have new fluid or pus coming from your incision.   °· You notice a bad smell coming from your incision site.   °· You have swelling, pain, or more redness at the incision or port site.   °· You have chest pain or shortness of breath. °Document Released: 12/09/2012 Document Revised: 02/23/2013 Document Reviewed: 12/09/2012 °Exit[employer]® Patient Information ©2015 [employer]. This information is not intended to replace  advice given to you by your health care provider. Make sure you discuss any questions you have with your health care provider. °Implanted Port Home Guide °An implanted port is a type of central line that is placed under the skin. Central lines are used to provide IV access when treatment or nutrition needs to be given through a person's veins. Implanted ports are used for long-term IV access. An implanted port may be placed because:  °· You need IV medicine that would be irritating to the small veins in your hands or arms.   °· You need long-term IV medicines, such as antibiotics.   °· You need IV nutrition for a long period.   °· You need frequent blood draws for lab tests.   °· You need dialysis.   °Implanted ports are usually placed in the chest area, but they can also be placed in the upper arm, the abdomen, or the leg. An implanted port has two main parts:  °· Reservoir. The reservoir is round and will appear as a small, raised area under your skin. The reservoir is the part where a needle is inserted to give medicines or draw blood.   °· Catheter. The catheter is a thin, flexible tube that extends from the reservoir. The catheter is placed into a large vein. Medicine that is inserted into the reservoir goes into the catheter and then into the vein.   °HOW WILL I CARE FOR MY INCISION SITE? °Do not get the incision site wet. Bathe or   shower until Fri mar 11th HOW IS MY PORT ACCESSED? Special steps must be taken to access the port:   Before the port is accessed, a numbing cream can be placed on the skin. This helps numb the skin over the port site.   Your health care provider uses a sterile technique to access the port.  Your health care provider must put on a mask and sterile gloves.  The skin over your port is cleaned carefully with an antiseptic and allowed to dry.  The port is gently pinched between sterile gloves, and a needle is inserted into the port.  Only "non-coring" port needles should  be used to access the port. Once the port is accessed, a blood return should be checked. This helps ensure that the port is in the vein and is not clogged.   If your port needs to remain accessed for a constant infusion, a clear (transparent) bandage will be placed over the needle site. The bandage and needle will need to be changed every week, or as directed by your health care provider.   Keep the bandage covering the needle clean and dry. Do not get it wet. Follow your health care provider's instructions on how to take a shower or bath while the port is accessed.   If your port does not need to stay accessed, no bandage is needed over the port.  WHAT IS FLUSHING? Flushing helps keep the port from getting clogged. Follow your health care provider's instructions on how and when to flush the port. Ports are usually flushed with saline solution or a medicine called heparin. The need for flushing will depend on how the port is used.   If the port is used for intermittent medicines or blood draws, the port will need to be flushed:   After medicines have been given.   After blood has been drawn.   As part of routine maintenance.   If a constant infusion is running, the port may not need to be flushed.  HOW LONG WILL MY PORT STAY IMPLANTED? The port can stay in for as long as your health care provider thinks it is needed. When it is time for the port to come out, surgery will be done to remove it. The procedure is similar to the one performed when the port was put in.  WHEN SHOULD I SEEK IMMEDIATE MEDICAL CARE? When you have an implanted port, you should seek immediate medical care if:   You notice a bad smell coming from the incision site.   You have swelling, redness, or drainage at the incision site.   You have more swelling or pain at the port site or the surrounding area.   You have a fever that is not controlled with medicine. Document Released: 02/18/2005 Document  Revised: 12/09/2012 Document Reviewed: 10/26/2012 [facility] Patient Information 2015 [place], [place]. This information is not intended to replace advice given to you by your health care provider. Make sure you discuss any questions you have with your health care providerConscious Sedation, Adult, Care After Refer to this sheet in the next few weeks. These instructions provide you with information on caring for yourself after your procedure. Your health care provider may also give you more specific instructions. Your treatment has been planned according to current medical practices, but problems sometimes occur. Call your health care provider if you have any problems or questions after your procedure. WHAT TO EXPECT AFTER THE PROCEDURE  After your procedure:  You may feel sleepy,  clumsy, and have poor balance for several hours.  Vomiting may occur if you eat too soon after the procedure. HOME CARE INSTRUCTIONS  Do not participate in any activities where you could become injured for at least 24 hours. Do not:  Drive.  Swim.  Ride a bicycle.  Operate heavy machinery.  Cook.  Use power tools.  Climb ladders.  Work from a high place.  Do not make important decisions or sign legal documents until you are improved.  If you vomit, drink water, juice, or soup when you can drink without vomiting. Make sure you have little or no nausea before eating solid foods.  Only take over-the-counter or prescription medicines for pain, discomfort, or fever as directed by your health care provider.  Make sure you and your family fully understand everything about the medicines given to you, including what side effects may occur.  You should not drink alcohol, take sleeping pills, or take medicines that cause drowsiness for at least 24 hours.  If you smoke, do not smoke without supervision.  If you are feeling better, you may resume normal activities 24 hours after you were sedated.  Keep all  appointments with your health care provider. SEEK MEDICAL CARE IF:  Your skin is pale or bluish in color.  You continue to feel nauseous or vomit.  Your pain is getting worse and is not helped by medicine.  You have bleeding or swelling.  You are still sleepy or feeling clumsy after 24 hours. SEEK IMMEDIATE MEDICAL CARE IF:  You develop a rash.  You have difficulty breathing.  You develop any type of allergic problem.  You have a fever. MAKE SURE YOU:  Understand these instructions.  Will watch your condition.  Will get help right away if you are not doing well or get worse. Document Released: 12/09/2012 Document Reviewed: 12/09/2012 [facility] Patient Information 2015 [place], [place]. This information is not intended to replace advice given to you by your health care provider. Make sure you discuss any questions you have with your health care provider.

## 2014-05-12 LAB — AFB CULTURE WITH SMEAR (NOT AT ARMC): Acid Fast Smear: NONE SEEN

## 2014-05-15 ENCOUNTER — Other Ambulatory Visit: Payer: Self-pay | Admitting: Oncology

## 2014-05-18 ENCOUNTER — Other Ambulatory Visit: Payer: Self-pay | Admitting: *Deleted

## 2014-05-18 DIAGNOSIS — C7931 Secondary malignant neoplasm of brain: Secondary | ICD-10-CM

## 2014-05-19 ENCOUNTER — Other Ambulatory Visit (HOSPITAL_BASED_OUTPATIENT_CLINIC_OR_DEPARTMENT_OTHER): Payer: PPO

## 2014-05-19 ENCOUNTER — Telehealth: Payer: Self-pay | Admitting: *Deleted

## 2014-05-19 ENCOUNTER — Telehealth: Payer: Self-pay | Admitting: Oncology

## 2014-05-19 ENCOUNTER — Ambulatory Visit (HOSPITAL_BASED_OUTPATIENT_CLINIC_OR_DEPARTMENT_OTHER): Payer: PPO | Admitting: Oncology

## 2014-05-19 ENCOUNTER — Other Ambulatory Visit: Payer: Self-pay | Admitting: *Deleted

## 2014-05-19 ENCOUNTER — Ambulatory Visit (HOSPITAL_BASED_OUTPATIENT_CLINIC_OR_DEPARTMENT_OTHER): Payer: PPO

## 2014-05-19 ENCOUNTER — Telehealth: Payer: Self-pay

## 2014-05-19 VITALS — BP 139/79 | HR 87 | Temp 97.6°F | Resp 18 | Ht 68.0 in | Wt 160.2 lb

## 2014-05-19 DIAGNOSIS — C7931 Secondary malignant neoplasm of brain: Secondary | ICD-10-CM

## 2014-05-19 DIAGNOSIS — C3431 Malignant neoplasm of lower lobe, right bronchus or lung: Secondary | ICD-10-CM

## 2014-05-19 DIAGNOSIS — R260 Ataxic gait: Secondary | ICD-10-CM

## 2014-05-19 DIAGNOSIS — C3491 Malignant neoplasm of unspecified part of right bronchus or lung: Secondary | ICD-10-CM

## 2014-05-19 DIAGNOSIS — Z5111 Encounter for antineoplastic chemotherapy: Secondary | ICD-10-CM

## 2014-05-19 LAB — COMPREHENSIVE METABOLIC PANEL (CC13)
ALT: 50 U/L (ref 0–55)
AST: 17 U/L (ref 5–34)
Albumin: 3.6 g/dL (ref 3.5–5.0)
Alkaline Phosphatase: 49 U/L (ref 40–150)
Anion Gap: 12 mEq/L — ABNORMAL HIGH (ref 3–11)
BUN: 20.2 mg/dL (ref 7.0–26.0)
CALCIUM: 9.4 mg/dL (ref 8.4–10.4)
CHLORIDE: 98 meq/L (ref 98–109)
CO2: 26 mEq/L (ref 22–29)
Creatinine: 0.8 mg/dL (ref 0.6–1.1)
EGFR: 76 mL/min/{1.73_m2} — AB (ref >90)
GLUCOSE: 136 mg/dL (ref 70–140)
Potassium: 3.6 mEq/L (ref 3.5–5.1)
Sodium: 136 mEq/L (ref 136–145)
Total Bilirubin: 1.31 mg/dL — ABNORMAL HIGH (ref 0.20–1.20)
Total Protein: 6.5 g/dL (ref 6.4–8.3)

## 2014-05-19 MED ORDER — HEPARIN SOD (PORK) LOCK FLUSH 100 UNIT/ML IV SOLN
500.0000 [IU] | Freq: Once | INTRAVENOUS | Status: AC | PRN
  Administered 2014-05-19: 500 [IU]
  Filled 2014-05-19: qty 5

## 2014-05-19 MED ORDER — SODIUM CHLORIDE 0.9 % IJ SOLN
10.0000 mL | INTRAMUSCULAR | Status: DC | PRN
  Administered 2014-05-19: 10 mL
  Filled 2014-05-19: qty 10

## 2014-05-19 MED ORDER — DEXAMETHASONE 2 MG PO TABS: 2.0000 mg | ORAL_TABLET | Freq: Two times a day (BID) | ORAL | Status: AC

## 2014-05-19 MED ORDER — SODIUM CHLORIDE 0.9 % IV SOLN
Freq: Once | INTRAVENOUS | Status: AC
  Administered 2014-05-19: 13:00:00 via INTRAVENOUS
  Filled 2014-05-19: qty 8

## 2014-05-19 MED ORDER — SODIUM CHLORIDE 0.9 % IV SOLN
431.5000 mg | Freq: Once | INTRAVENOUS | Status: AC
  Administered 2014-05-19: 430 mg via INTRAVENOUS
  Filled 2014-05-19: qty 43

## 2014-05-19 MED ORDER — SODIUM CHLORIDE 0.9 % IV SOLN
Freq: Once | INTRAVENOUS | Status: AC
  Administered 2014-05-19: 13:00:00 via INTRAVENOUS

## 2014-05-19 MED ORDER — SODIUM CHLORIDE 0.9 % IV SOLN
480.0000 mg/m2 | Freq: Once | INTRAVENOUS | Status: AC
  Administered 2014-05-19: 900 mg via INTRAVENOUS
  Filled 2014-05-19: qty 36

## 2014-05-19 MED ORDER — PROCHLORPERAZINE MALEATE 5 MG PO TABS: 5.0000 mg | ORAL_TABLET | Freq: Four times a day (QID) | ORAL | Status: AC | PRN

## 2014-05-19 NOTE — Patient Instructions (Signed)
[facility] Discharge Instructions for Patients Receiving Chemotherapy  Today you received the following chemotherapy agents Alimta and Carboplatin  To help prevent nausea and vomiting after your treatment, we encourage you to take your nausea medication as prescribed.   If you develop nausea and vomiting that is not controlled by your nausea medication, call the clinic.   BELOW ARE SYMPTOMS THAT SHOULD BE REPORTED IMMEDIATELY:  *FEVER GREATER THAN 100.5 F  *CHILLS WITH OR WITHOUT FEVER  NAUSEA AND VOMITING THAT IS NOT CONTROLLED WITH YOUR NAUSEA MEDICATION  *UNUSUAL SHORTNESS OF BREATH  *UNUSUAL BRUISING OR BLEEDING  TENDERNESS IN MOUTH AND THROAT WITH OR WITHOUT PRESENCE OF ULCERS  *URINARY PROBLEMS  *BOWEL PROBLEMS  UNUSUAL RASH Items with * indicate a potential emergency and should be followed up as soon as possible.  Feel free to call the clinic you have any questions or concerns. The clinic phone number is [phone].  Please show the [facility] at check-in to the Emergency Department and triage nurse.

## 2014-05-19 NOTE — Progress Notes (Signed)
  [facility] OFFICE PROGRESS NOTE   Diagnosis: Non-small cell lung cancer  INTERVAL HISTORY:   She returns as scheduled. No more syncope event. She continues Decadron. She is walking with a cane. A right Port-A-Cath was placed 05/11/2014.  Objective:  Vital signs in last 24 hours:  Blood pressure 139/79, pulse 87, temperature 97.6 F (36.4 C), temperature source Oral, resp. rate 18, height $RemoveBe'5\' 8"'SpRkmOZGw$  (1.727 m), weight 160 lb 3.2 oz (72.666 kg), SpO2 97 %.    HEENT: No thrush Resp: Lungs clear bilaterally with decreased breath sounds at the right upper chest, no respiratory distress Cardio: Regular rate and rhythm GI: No hepatomegaly, nontender Vascular: No leg edema Neuro: Alert and oriented, follows commands, the motor exam appears intact in the upper and lower extremities     Portacath/PICC-without erythema, resolving ecchymosis  Lab Results:  Lab Results  Component Value Date   WBC 8.3 05/11/2014   HGB 13.8 05/11/2014   HCT 41.3 05/11/2014   MCV 102.0* 05/11/2014   PLT 443* 05/11/2014   NEUTROABS 6.4 05/11/2014   Medications: I have reviewed the patient's current medications.  Assessment/Plan: 1. Right lower lung mass with possible lung metastases on a chest CT 03/29/2014  Bronchoscopy 03/30/2014-nondiagnostic tissue obtained  PET scan 04/15/2014 showed intracranial metastasis. Focus of hypermetabolism identified about the left medial parietal lobe. Superior segment right lower lobe mass with contact of the major fissure measuring 4.3 x 3.6 cm and SUV max of 8.9. No hypermetabolism within mediastinal or hilar nodes. Hypermetabolism identified about the low anus. SUV Max 8.3. No well-defined mass in this area.  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  ALK gene rearrangement not detected.  EGFR mutation not detected 2. Multiple brain  metastases with vasogenic edema-palliative radiation started 04/04/2014 and completed 04/19/2014. 3. Gait ataxia and memory loss secondary to #2 4. Hyperlipidemia 5. Altered level of consciousness/hypotension during an office visit 04/27/2014. Brain CT without acute changes. Chest CT negative for pulmonary embolism. Question vasovagal event or related to being tapered off of Decadron.    Disposition:  Lori Park appears stable. The plan is to proceed with Alimta/carboplatin chemotherapy today. She will return for an office visit and chemotherapy in 3 weeks. The plan is to complete 4 cycles of Alimta/carboplatin prior to a restaging CT evaluation. We will schedule home physical therapy. We tapered the Decadron to 2 mg twice daily.  Betsy Coder, MD  05/19/2014  10:49 AM

## 2014-05-19 NOTE — Telephone Encounter (Signed)
Last office visit notes faxed, upon request, to Babs Bertin, Intake RN @ [facility]; to complete paperwork for patient to be seen.

## 2014-05-19 NOTE — Telephone Encounter (Addendum)
Gave avs & calendar for April. Sent message to schedule treatment.

## 2014-05-19 NOTE — Telephone Encounter (Signed)
Per staff message and POF I have scheduled appts. Advised scheduler of appts. JMW  

## 2014-05-20 ENCOUNTER — Telehealth: Payer: Self-pay | Admitting: *Deleted

## 2014-05-20 NOTE — Telephone Encounter (Signed)
Called patient to ask about rescheduling her fu visit for 05-26-14 @ 8:30 am due to Dr. Pablo Ledger being in a meeting, patient agreed to come in on 05-26-14 @ 2:20 p.m.

## 2014-05-23 ENCOUNTER — Telehealth: Payer: Self-pay | Admitting: *Deleted

## 2014-05-23 MED ORDER — SENNOSIDES-DOCUSATE SODIUM 8.6-50 MG PO TABS: 1.0000 | ORAL_TABLET | Freq: Two times a day (BID) | ORAL | Status: AC

## 2014-05-23 NOTE — Telephone Encounter (Signed)
Late entry for 1355: Called pt to follow up after chemo on 3/17. She reports she feels well. Denies nausea/ vomiting. Pt reports constipation- on Senokot once daily. Instructed her to increase Senokot to BID. She voiced understanding, appreciation for call.

## 2014-05-23 NOTE — Telephone Encounter (Signed)
Spoke with patient. States she is doing well and had already spoken with nurse today. Appreciated call

## 2014-05-24 ENCOUNTER — Telehealth: Payer: Self-pay | Admitting: *Deleted

## 2014-05-24 NOTE — Telephone Encounter (Signed)
Daughter called to say she is concerned because her mother has had two falls recently.  One while walking up the driveway after picking up newspaper, and one while letting the cat out the backdoor.  She reports that her legs just became weak and gave out.  The daughter's concern is the amount of decadron she is taking, and should it be more.  She said her mother seems quieter than usual, and is having some trouble finding the words she wants when communicating.  She said this is not as bad as when her brain mets was first diagnosed, but worse than it was after treatment.  Encouraged daughter to discuss all of these symptoms and concerns with Dr. Pablo Ledger at the appointment on Thursday, 05/26/14.  She said had also left a message for [employer], RN in [employer].

## 2014-05-26 ENCOUNTER — Ambulatory Visit: Admission: RE | Admit: 2014-05-26 | Payer: PPO | Source: Ambulatory Visit | Admitting: Radiation Oncology

## 2014-05-26 ENCOUNTER — Ambulatory Visit
Admission: RE | Admit: 2014-05-26 | Discharge: 2014-05-26 | Disposition: A | Discharge status: Home / Self Care | Payer: PPO | Source: Ambulatory Visit | Attending: Radiation Oncology | Admitting: Radiation Oncology

## 2014-05-26 ENCOUNTER — Encounter: Payer: Self-pay | Admitting: *Deleted

## 2014-05-26 ENCOUNTER — Encounter: Payer: Self-pay | Admitting: Radiation Oncology

## 2014-05-26 DIAGNOSIS — C7931 Secondary malignant neoplasm of brain: Secondary | ICD-10-CM

## 2014-05-26 MED ORDER — OXYCODONE HCL 5 MG PO TABS: 5.0000 mg | ORAL_TABLET | ORAL | Status: DC | PRN

## 2014-05-26 MED ORDER — MORPHINE SULFATE 4 MG/ML IJ SOLN
4.0000 mg | Freq: Once | INTRAMUSCULAR | Status: DC
  Filled 2014-05-26: qty 1

## 2014-05-26 NOTE — Progress Notes (Signed)
[facility] Work  Clinical Social Work was referred by Pension scheme manager for assessment of psychosocial needs due to family and pt concerns.  Clinical Social Worker met with patient, husband, daughter-Brooke and pt's niece in exam room 7 to offer support and assess for needs. Pt and family are struggling with decisions around care plan and how to move forward with current situation. CSW discussed common emotions and issues experienced by pts and caregivers with her diagnosis. CSW discussed various resources for additional support and coping techniques. Pt and husband are open to attend [facility] next month. Daughter, Jerene Pitch had many questions and shared frustrations as well. We discussed goals for the months ahead and [place], initially struggled to come out with some ideas. She was given time to reflect and at the end of our meeting blurted out she wanted to make it to her 69th birthday in August, she wanted to try to walk, think and spend time with her family and friends. She plans to give this more thought over the days ahead.   They appear to need more support at home and CSW reviewed options to assist and explained that CNA would most likely be paid out of pocket. They are open to this and both feel they need the additional support. Husband was made aware of caregiver strain and the need for self-care. He plans to find some personal time and go to the gym.   They would like to explore hospices services and would like to be connected with Hospice and [facility] to ask them questions and learn more about that option. They are aware MCR would not cover aggressive cancer treatment and hospice at the same time. They are aware to reach out to [facility] team as needed and were provided handouts on additional resources.  Clinical Social Work interventions: Multimedia programmer education and referral  Loren Racer, [place] Worker [place]  [place] Phone: [phone] Fax: [phone]

## 2014-05-26 NOTE — Progress Notes (Signed)
   Department of Radiation Oncology  Phone:  [phone] Fax:        [phone]   Name: Lori Park MRN: [id number]  DOB: Jul 08, 1945  Date: 05/26/2014  Follow Up Visit Note  Diagnosis: Stage IV Lung Cancer  Summary and Interval since last radiation: 1 month from whole brain radiation to a total dose of 30 Gy  Interval History: Lori Park presents today for routine followup.  She has completed chemotherapy and has her next scheduled for 4/7. She has been falling more and is back on 4 mg bid of decadron since the syncopal episode in medical oncology. Her family is concerned that she is no long mobile or active. They would like to discuss hospice. She is unsure whether she would like to undergo her brain MRI. She has constipation for 4-6 days and increasing back pain not controlled with Aleve since a fall a few days ago.   Physical Exam:  Filed Vitals:   05/26/14 1421  BP: 129/86  Pulse: 100  Temp: 97.4 F (36.3 C)  TempSrc: Oral  Resp: 20  Height: 5\' 8"  (1.727 m)  SpO2: 95%   In moderate distress. Laying on table or sitting in wheelchair. 3/5 strenght bilaterally. No oral thrush.   IMPRESSION: Lori Park is a 69 y.o. 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 deconditioning  PLAN:  We discussed all of the above. We discussed brain MRI and spinal MRI. We discussed continuing treatment vs. Hospice. We discussed continuing PT at home. She would like to investigate hospice (not invoke just "talk to them") and we will follow up with her on Monday. She declined spine mri and brain mri. We will continue her decadron for now. I gave her a script for oxycodone. We will follow up with her Monday. Our social worker was able to meet with the family for about 30 minutes today for discussion of goals of care and support.      Thea Silversmith, MD

## 2014-05-26 NOTE — Progress Notes (Addendum)
Follow up s/p whole brain rad txs 04/04/14-04/19/14, patient has had 3 falls in last 2 weeks, pain 8/10 low back pain, , no head ache, looks like thrush in her mouth decadron 2mg   Po bid,  only took 4-5 steps today, last pain med aleve taken last night, hasn't had a bowel movement in 5-6 days, stopped taking senokot, very fatigued, poor appetite, last fall this Monday, didn't go to the DR ,but feels she needs xrays now, Physical therapy has been ordered, Mosetta Pigeon NP appt  06/09/14 labs and chemotherapy 2:30 PM

## 2014-05-27 ENCOUNTER — Telehealth: Payer: Self-pay

## 2014-05-27 ENCOUNTER — Telehealth: Payer: Self-pay | Admitting: *Deleted

## 2014-05-27 DIAGNOSIS — C7931 Secondary malignant neoplasm of brain: Secondary | ICD-10-CM

## 2014-05-27 DIAGNOSIS — C3491 Malignant neoplasm of unspecified part of right bronchus or lung: Secondary | ICD-10-CM

## 2014-05-27 NOTE — Telephone Encounter (Signed)
Called pt's husband. Dr. Benay Spice has ordered hospice referral. Husband asks if she should continue to wean Decadron? Dr. Benay Spice recommends continuing 2 mg BID for now. He voiced understanding. Pt will follow up on 4/7 as scheduled. Infusion appointments canceled.

## 2014-05-27 NOTE — Telephone Encounter (Signed)
Returned husbands call  He states that patient and family have agreed not to have any further treatment or scans and would like a referral to hospice.Told him I would ask Dr.Sherrill or staff to take care of this and I would call him next week with status of referral.Dr.Wentworth will be back in office on Tuesday 05/31/14.I was suppose to call patient/family on Monday to inquire about what they decided according to follow up appointment on 05/26/14.Family needed lots of care.Abby Potash MSW came down to speak with family as well.See Dr.Wentworth note.

## 2014-06-05 ENCOUNTER — Other Ambulatory Visit: Payer: Self-pay | Admitting: Oncology

## 2014-06-06 ENCOUNTER — Other Ambulatory Visit: Payer: Self-pay | Admitting: Nurse Practitioner

## 2014-06-07 ENCOUNTER — Telehealth: Payer: Self-pay | Admitting: *Deleted

## 2014-06-07 ENCOUNTER — Telehealth: Payer: Self-pay | Admitting: Nurse Practitioner

## 2014-06-07 NOTE — Telephone Encounter (Signed)
Per provider schedule moved pt's labs/ov from 04/07 to 04/08, sent msg through my chart and mailed out schedule.... KJ

## 2014-06-07 NOTE — Telephone Encounter (Signed)
Received call from pt's husband requesting to reschedule 4/8 appt. Requesting 4/14. He reports she is getting around better, since being off chemo. Ambulating with walker. Hospice has been helpful, someone comes out three times/ week.  Pt has 6 month follow up with PCP scheduled for 4/14. Husband asks if this is necessary. Told him this is reasonable to cancel since pt is now being followed by hospice/ Dr. Benay Spice. Informed him we will call with new appointment.

## 2014-06-08 ENCOUNTER — Telehealth: Payer: Self-pay | Admitting: Nurse Practitioner

## 2014-06-08 NOTE — Telephone Encounter (Signed)
Labs/ov r/s from 04/08 to 04/14 per 04/06 POF, pt's husband is aware.... KJ

## 2014-06-08 NOTE — Telephone Encounter (Signed)
Called Lori Park with appointment for 4/14. He voiced understanding and appreciation for call.

## 2014-06-09 ENCOUNTER — Other Ambulatory Visit: Payer: PPO

## 2014-06-09 ENCOUNTER — Ambulatory Visit: Payer: PPO

## 2014-06-09 ENCOUNTER — Ambulatory Visit: Payer: PPO | Admitting: Nurse Practitioner

## 2014-06-10 ENCOUNTER — Ambulatory Visit: Payer: PPO | Admitting: Nurse Practitioner

## 2014-06-10 ENCOUNTER — Other Ambulatory Visit: Payer: PPO

## 2014-06-16 ENCOUNTER — Other Ambulatory Visit (HOSPITAL_BASED_OUTPATIENT_CLINIC_OR_DEPARTMENT_OTHER): Payer: PPO

## 2014-06-16 ENCOUNTER — Telehealth: Payer: Self-pay | Admitting: Oncology

## 2014-06-16 ENCOUNTER — Ambulatory Visit (HOSPITAL_BASED_OUTPATIENT_CLINIC_OR_DEPARTMENT_OTHER): Payer: PPO | Admitting: Nurse Practitioner

## 2014-06-16 VITALS — BP 139/87 | HR 86 | Temp 97.8°F | Resp 18 | Ht 68.0 in | Wt 158.1 lb

## 2014-06-16 DIAGNOSIS — C3431 Malignant neoplasm of lower lobe, right bronchus or lung: Secondary | ICD-10-CM

## 2014-06-16 DIAGNOSIS — R5383 Other fatigue: Secondary | ICD-10-CM

## 2014-06-16 DIAGNOSIS — C3491 Malignant neoplasm of unspecified part of right bronchus or lung: Secondary | ICD-10-CM

## 2014-06-16 DIAGNOSIS — C7931 Secondary malignant neoplasm of brain: Secondary | ICD-10-CM

## 2014-06-16 LAB — CBC WITH DIFFERENTIAL/PLATELET
BASO%: 0.2 % (ref 0.0–2.0)
BASOS ABS: 0 10*3/uL (ref 0.0–0.1)
EOS ABS: 0 10*3/uL (ref 0.0–0.5)
EOS%: 0.2 % (ref 0.0–7.0)
HCT: 36.5 % (ref 34.8–46.6)
HGB: 12.1 g/dL (ref 11.6–15.9)
LYMPH%: 3.3 % — AB (ref 14.0–49.7)
MCH: 34.5 pg — AB (ref 25.1–34.0)
MCHC: 33.1 g/dL (ref 31.5–36.0)
MCV: 104.1 fL — AB (ref 79.5–101.0)
MONO#: 0.5 10*3/uL (ref 0.1–0.9)
MONO%: 5.6 % (ref 0.0–14.0)
NEUT%: 90.7 % — ABNORMAL HIGH (ref 38.4–76.8)
NEUTROS ABS: 8.3 10*3/uL — AB (ref 1.5–6.5)
PLATELETS: 314 10*3/uL (ref 145–400)
RBC: 3.5 10*6/uL — AB (ref 3.70–5.45)
RDW: 15.8 % — ABNORMAL HIGH (ref 11.2–14.5)
WBC: 9.2 10*3/uL (ref 3.9–10.3)
lymph#: 0.3 10*3/uL — ABNORMAL LOW (ref 0.9–3.3)

## 2014-06-16 LAB — COMPREHENSIVE METABOLIC PANEL (CC13)
ALBUMIN: 3.1 g/dL — AB (ref 3.5–5.0)
ALT: 34 U/L (ref 0–55)
ANION GAP: 11 meq/L (ref 3–11)
AST: 16 U/L (ref 5–34)
Alkaline Phosphatase: 88 U/L (ref 40–150)
BUN: 12.4 mg/dL (ref 7.0–26.0)
CALCIUM: 9.1 mg/dL (ref 8.4–10.4)
CHLORIDE: 97 meq/L — AB (ref 98–109)
CO2: 26 meq/L (ref 22–29)
CREATININE: 0.7 mg/dL (ref 0.6–1.1)
EGFR: 89 mL/min/{1.73_m2} — ABNORMAL LOW (ref >90)
Glucose: 133 mg/dl (ref 70–140)
POTASSIUM: 4.2 meq/L (ref 3.5–5.1)
Sodium: 134 mEq/L — ABNORMAL LOW (ref 136–145)
Total Bilirubin: 1.52 mg/dL — ABNORMAL HIGH (ref 0.20–1.20)
Total Protein: 6.3 g/dL — ABNORMAL LOW (ref 6.4–8.3)

## 2014-06-16 NOTE — Telephone Encounter (Signed)
gave and pritned appt sched and avs fo rpt for May

## 2014-06-16 NOTE — Progress Notes (Addendum)
[place] OFFICE PROGRESS NOTE   Diagnosis:  Non-small cell lung cancer  INTERVAL HISTORY:   Lori Park returns as scheduled. She completed cycle 1 carboplatin/Alimta 05/19/2014. She feels that overall she tolerated chemotherapy well. Her husband noted that she became extremely fatigued. She decided against further chemotherapy. She is now enrolled in the hospice program. She denies nausea/vomiting. Bowels moving. She has intermittent back pain and takes oxycodone as needed. She has had a few falls. She and her husband note some "coordination" difficulties with the right leg. She is no longer able to walk with a cane due to balance problems. She occasionally walks with a walker. They are mostly utilizing a wheelchair.  Objective:  Vital signs in last 24 hours:  Blood pressure 139/87, pulse 86, temperature 97.8 F (36.6 C), temperature source Oral, resp. rate 18, height $RemoveBe'5\' 8"'oGKoRBNYi$  (1.727 m), weight 158 lb 1.6 oz (71.714 kg), SpO2 97 %.    HEENT: No thrush or ulcers. Resp: Lungs clear bilaterally. Breath sounds diminished at the right upper lung field. No respiratory distress. Cardio: Regular rate and rhythm. GI: Abdomen soft and nontender. No hepatomegaly. Vascular: No leg edema. Calves soft and nontender. Neuro: Alert and oriented. She appears to have a mild expressive aphasia. Decreased left nasolabial fold. Motor strength 5 over 5.  Port-A-Cath without erythema.    Lab Results:  Lab Results  Component Value Date   WBC 9.2 06/16/2014   HGB 12.1 06/16/2014   HCT 36.5 06/16/2014   MCV 104.1* 06/16/2014   PLT 314 06/16/2014   NEUTROABS 8.3* 06/16/2014    Imaging:  No results found.  Medications: I have reviewed the patient's current medications.  Assessment/Plan: 1. Right lower lung mass with possible lung metastases on a chest CT 03/29/2014  Bronchoscopy 03/30/2014-nondiagnostic tissue obtained  PET scan 04/15/2014 showed intracranial metastasis.  Focus of hypermetabolism identified about the left medial parietal lobe. Superior segment right lower lobe mass with contact of the major fissure measuring 4.3 x 3.6 cm and SUV max of 8.9. No hypermetabolism within mediastinal or hilar nodes. Hypermetabolism identified about the low anus. SUV Max 8.3. No well-defined mass in this area.  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  ALK gene rearrangement not detected.  EGFR mutation not detected  Cycle 1 carboplatin/Alimta 05/19/2014  Further chemotherapy/scans declined. Referral to hospice per family request. 2. Multiple brain metastases with vasogenic edema-palliative radiation started 04/04/2014 and completed 04/19/2014. 3. Gait ataxia and memory loss secondary to #2 4. Hyperlipidemia 5. Altered level of consciousness/hypotension during an office visit 04/27/2014. Brain CT without acute changes. Chest CT negative for pulmonary embolism. Question vasovagal event or related to being tapered off of Decadron.   Disposition: Lori Park completed 1 cycle of carboplatin/Alimta. She decided against further chemotherapy and is now enrolled in the hospice program. Plan to continue a supportive/comfort care approach. We discontinued multiple nonessential medications. She will return for a follow-up visit in approximately 4 weeks. She reports her hospice nurse will be flushing the Port-A-Cath at home in 2 weeks.  Patient seen with Dr. Benay Spice.  Ned Card ANP/GNP-BC   06/16/2014  11:33 AM  This was a shared visit with Ned Card. Lori Park has decided to not receive additional chemotherapy. She has enrolled in the [facility] hospice program. We consolidated her medical regimen today. She will return for an office visit in one month.  Julieanne Manson, M.D.

## 2014-06-30 ENCOUNTER — Ambulatory Visit: Payer: PPO | Admitting: Nurse Practitioner

## 2014-06-30 ENCOUNTER — Ambulatory Visit: Payer: PPO

## 2014-06-30 ENCOUNTER — Other Ambulatory Visit: Payer: PPO

## 2014-07-14 ENCOUNTER — Telehealth: Payer: Self-pay | Admitting: Oncology

## 2014-07-14 ENCOUNTER — Ambulatory Visit (HOSPITAL_BASED_OUTPATIENT_CLINIC_OR_DEPARTMENT_OTHER): Payer: PPO | Admitting: Oncology

## 2014-07-14 VITALS — BP 113/79 | HR 99 | Temp 97.8°F | Resp 18 | Ht 68.0 in | Wt 155.8 lb

## 2014-07-14 DIAGNOSIS — C7931 Secondary malignant neoplasm of brain: Secondary | ICD-10-CM

## 2014-07-14 DIAGNOSIS — C3491 Malignant neoplasm of unspecified part of right bronchus or lung: Secondary | ICD-10-CM

## 2014-07-14 DIAGNOSIS — R260 Ataxic gait: Secondary | ICD-10-CM | POA: Diagnosis not present

## 2014-07-14 DIAGNOSIS — R413 Other amnesia: Secondary | ICD-10-CM

## 2014-07-14 DIAGNOSIS — C3431 Malignant neoplasm of lower lobe, right bronchus or lung: Secondary | ICD-10-CM | POA: Diagnosis not present

## 2014-07-14 DIAGNOSIS — E785 Hyperlipidemia, unspecified: Secondary | ICD-10-CM

## 2014-07-14 NOTE — Telephone Encounter (Signed)
Pt confirmed MD visit per 05/12 POF, gave pt AVS and Calendar.... KJ

## 2014-07-14 NOTE — Progress Notes (Signed)
  [place] OFFICE PROGRESS NOTE   Diagnosis: Non-small cell lung cancer  INTERVAL HISTORY:   Lori Park returns for scheduled visit. No new neurologic symptoms. No seizure. She continues Decadron. She is limited in her ability to ambulate. She uses a bedside commode. She is followed by the Hospice nurse weekly and nursing assistance help with bathing 3 days per week. Good appetite. She takes naps. She bruises easily.  Objective:  Vital signs in last 24 hours:  Blood pressure 113/79, pulse 99, temperature 97.8 F (36.6 C), temperature source Oral, resp. rate 18, height _0  (1.727 m), weight 155 lb 12.8 oz (70.67 kg), SpO2 100 %.    HEENT: No thrush Resp: Lungs clear bilaterally, decreased breath sounds at the right upper chest, no respiratory distress Cardio: Regular rate and rhythm GI: No hepatomegaly, nontender Vascular: No leg edema Neuro: Alert and oriented, the motor exam appears grossly intact in the upper and lower extremities. She is able to ambulate a short distance with assistance     Portacath/PICC-without erythema  Medications: I have reviewed the patient's current medications.  Assessment/Plan: 1. Right lower lung mass with possible lung metastases on a chest CT 03/29/2014  Bronchoscopy 03/30/2014-nondiagnostic tissue obtained  PET scan 04/15/2014 showed intracranial metastasis. Focus of hypermetabolism identified about the left medial parietal lobe. Superior segment right lower lobe mass with contact of the major fissure measuring 4.3 x 3.6 cm and SUV max of 8.9. No hypermetabolism within mediastinal or hilar nodes. Hypermetabolism identified about the low anus. SUV Max 8.3. No well-defined mass in this area.  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  ALK gene rearrangement not detected.  EGFR mutation not detected  Cycle  1 carboplatin/Alimta 05/19/2014  Further chemotherapy/scans declined. Referral to hospice per family request. 2. Multiple brain metastases with vasogenic edema-palliative radiation started 04/04/2014 and completed 04/19/2014. 3. Gait ataxia and memory loss secondary to #2 4. Hyperlipidemia 5. Altered level of consciousness/hypotension during an office visit 04/27/2014. Brain CT without acute changes. Chest CT negative for pulmonary embolism. Question vasovagal event or related to being tapered off of Decadron.   Disposition:  She appears stable. She will continue Decadron at the current dose. She is followed by the [facility] program. Lori Park reports easy bruising, likely related to Decadron therapy. She will discontinue aspirin. She will return for an office visit in one month.  Betsy Coder, MD  07/14/2014  10:53 AM

## 2014-07-15 NOTE — Addendum Note (Signed)
Addended by: Brien Few on: 07/15/2014 02:47 PM   Modules accepted: Orders, Medications

## 2014-08-04 ENCOUNTER — Telehealth: Payer: Self-pay | Admitting: *Deleted

## 2014-08-04 NOTE — Telephone Encounter (Signed)
ok 

## 2014-08-04 NOTE — Telephone Encounter (Signed)
VM message from hospice RN requesting verbal order for Hospice MD, Dr. Tomasa Hosteller, to sign DNR form. Pt is ready to have DNR.

## 2014-08-09 ENCOUNTER — Telehealth: Payer: Self-pay | Admitting: *Deleted

## 2014-08-09 ENCOUNTER — Telehealth: Payer: Self-pay | Admitting: Nurse Practitioner

## 2014-08-09 ENCOUNTER — Other Ambulatory Visit: Payer: Self-pay | Admitting: *Deleted

## 2014-08-09 NOTE — Telephone Encounter (Signed)
PT. IS "BEDRIDDEN" NOW. SHE IS RECEIVING CARE FROM HOSPICE.

## 2014-08-09 NOTE — Telephone Encounter (Signed)
Pt's spouse called to cancel visit due to pt is with Hospice per 06/07 POF... KJ

## 2014-08-16 ENCOUNTER — Ambulatory Visit: Payer: PPO | Admitting: Nurse Practitioner

## 2014-08-29 ENCOUNTER — Other Ambulatory Visit: Payer: Self-pay

## 2014-11-03 NOTE — Patient Outreach (Signed)
[facility]) Care Management  11/03/2014  Maesyn Frisinger [place] 07-Jun-1945 [id number]   Referral from HTA tier 4 list, assigned to Dannielle Huh, [facility] for patient outreach.  Mallorey Odonell L. Mendy Lapinsky, [place] Care Management Assistant

## 2014-11-10 ENCOUNTER — Other Ambulatory Visit: Payer: Self-pay | Admitting: *Deleted

## 2014-11-10 NOTE — Patient Outreach (Signed)
Lori Park) Care Management  11/10/2014  Lori Park 03/25/1945 [id number]   Assessment: Telephone screening Referral from [employer] to engage with [facility] care management services. Call placed to patient and spoke with a man who identified himself as patient's husband Lori Park). Identity verified using two identifiers. Care management coordinator introduced self and explained purpose of the call.  Per husband, patient is unavailable to speak with. He states that patient is "on her final stages of brain and lung cancer since March 28". He verbalized that "there's nothing else that we really need at this time". He also states, "whatever services we need is provided for by Hospice at this point". Per report, patient has hospice nurse weekly and nurse aide 3 times a week to assist with bathing. Patient is being followed-up by [facility] program.   Plan: Will close case. Will notify primary care provider of case closure.  Lori Park, BSN, RN-BC [place] Management Coordinator Cell: [phone]

## 2014-11-11 NOTE — Patient Outreach (Signed)
[facility]) Care Management  11/11/2014  [place] [birth year] [id number]   Notification from Dannielle Huh, RN to close case due to patient refused [place] Management services.  Thanks, Ronnell Freshwater. [place], [place] Assistant Phone: [phone] Fax: [phone]

## 2014-12-20 ENCOUNTER — Telehealth: Payer: Self-pay | Admitting: *Deleted

## 2014-12-20 NOTE — Telephone Encounter (Signed)
Message from pt's husband asking "Where do we stand?" on death certificate. Returned call, informed him Dr. Benay Spice has not yet received death certificate. He will follow [facility] with [facility].

## 2014-12-20 NOTE — Telephone Encounter (Signed)
Late entry for 12/19/14: Message from [place], [place] with hospice reporting pt expired on 2015-01-03 at 1816. Dr. Benay Spice made aware.

## 2015-01-03 DEATH — deceased

## 2015-05-26 ENCOUNTER — Ambulatory Visit: Payer: PPO | Admitting: Radiation Oncology

## 2015-10-26 IMAGING — CT CT ANGIO CHEST
1 of 4 series · 18 of 30 positions shown · IV contrast (OMNIPAQUE 350)
Comparison: PET-CT dated 04/15/2014.  CT chest dated 03/29/2014.

CLINICAL DATA: Syncope.  Lung cancer.

EXAM:
CT ANGIOGRAPHY CHEST WITH CONTRAST
TECHNIQUE: Multidetector CT imaging of the chest was performed using the
standard protocol during bolus administration of intravenous
contrast. Multiplanar CT image reconstructions and MIPs were
obtained to evaluate the vascular anatomy.
CONTRAST:  100mL OMNIPAQUE IOHEXOL 350 MG/ML SOLN

[Series 7: thins for pacs · axial · 0.61mm/px · z∈[+1168,+1386]mm · 18 of 247 slices shown]
[im 14/247  lung]
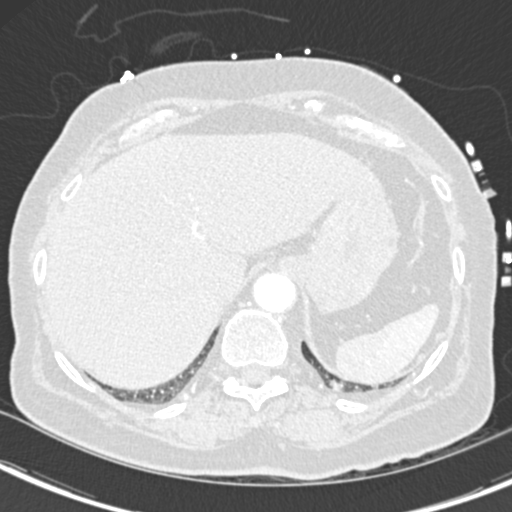
[im 28/247  mediastinal]
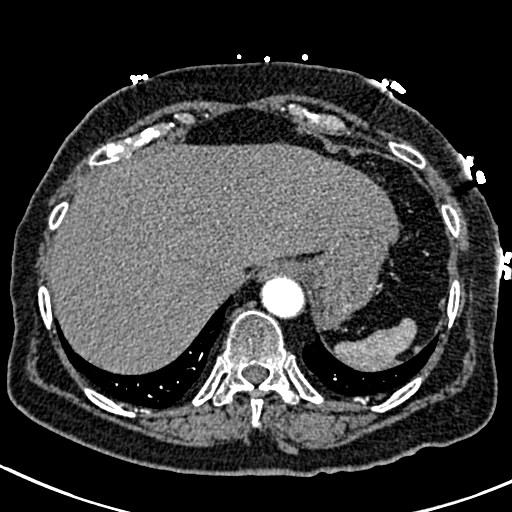
[im 42/247  lung]
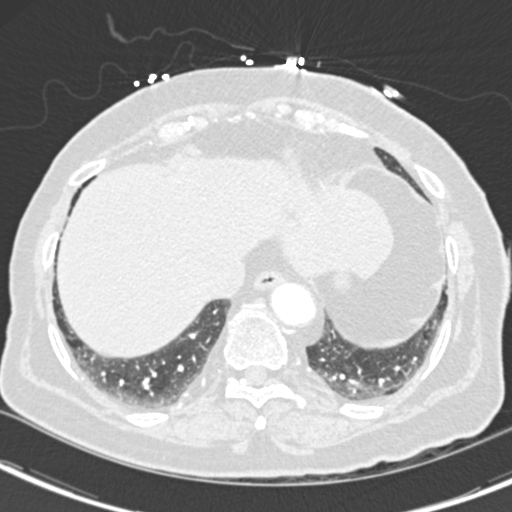
[im 55/247  mediastinal]
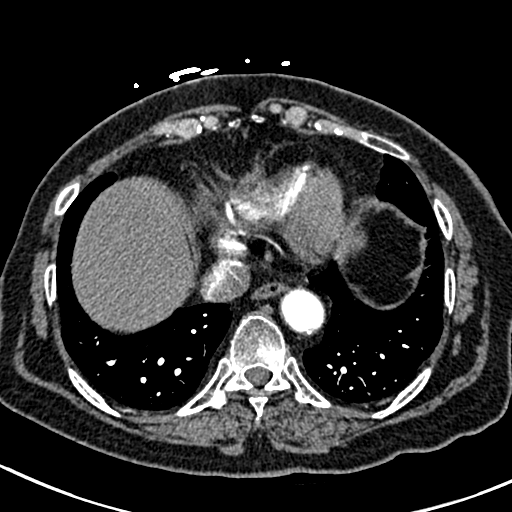
[im 69/247  lung]
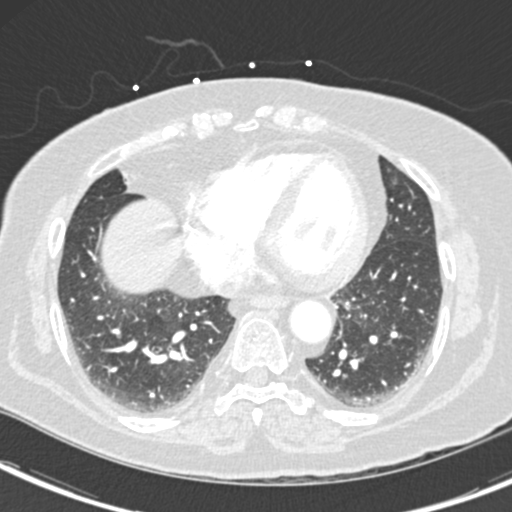
[im 83/247  mediastinal]
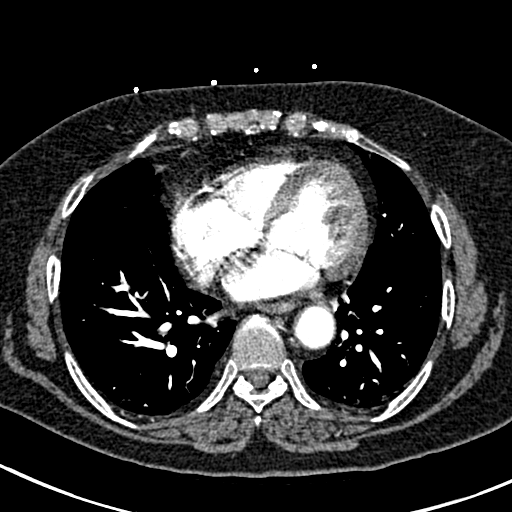
[im 96/247  lung]
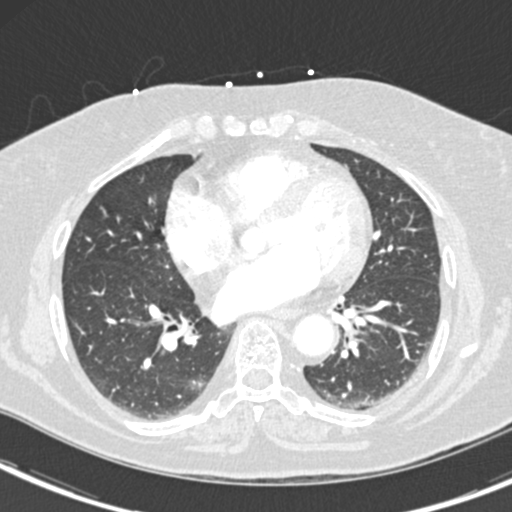
[im 108/247  mediastinal]
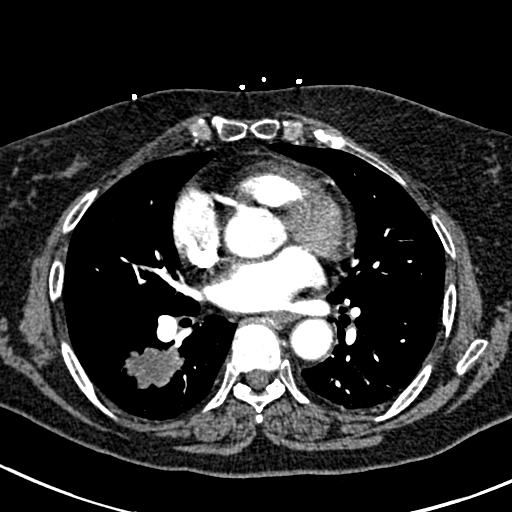
[im 110/247  lung]
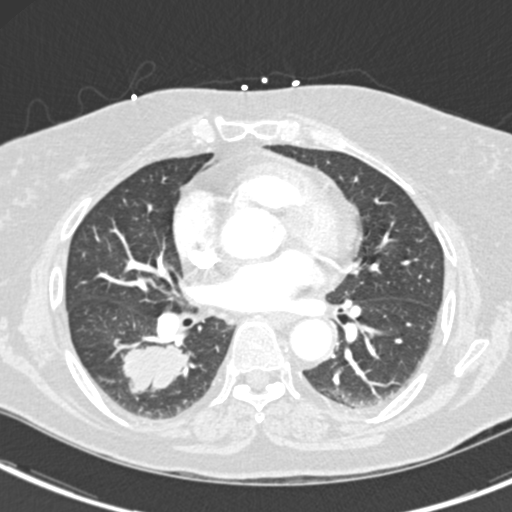
[im 124/247  mediastinal]
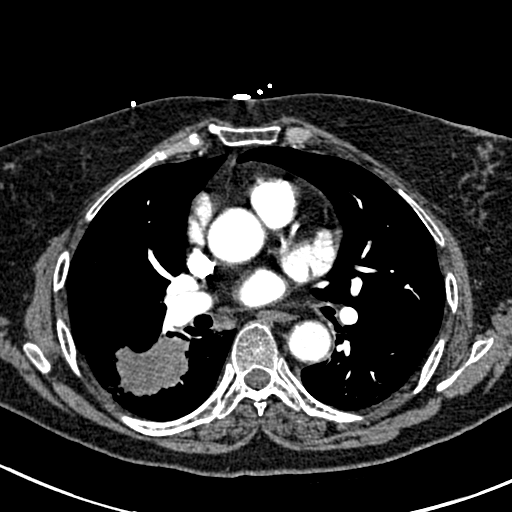
[im 137/247  lung]
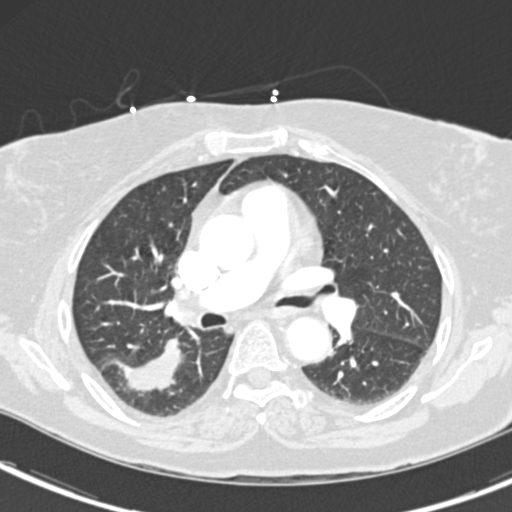
[im 151/247  mediastinal]
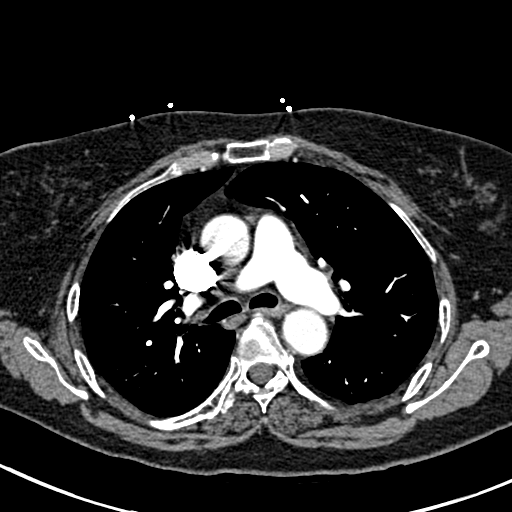
[im 165/247  lung]
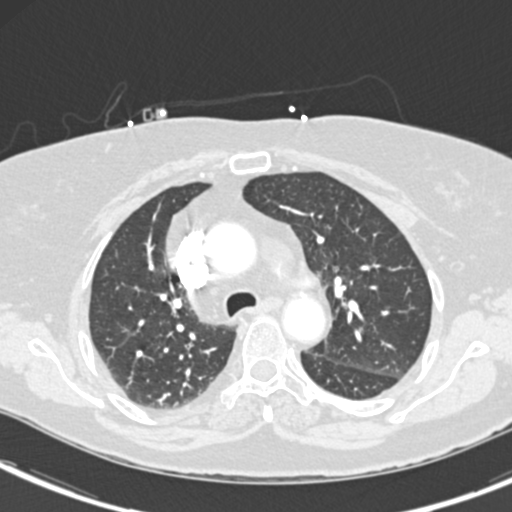
[im 178/247  mediastinal]
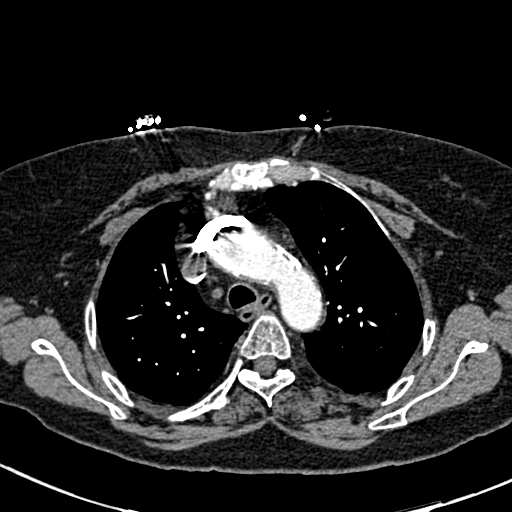
[im 192/247  lung]
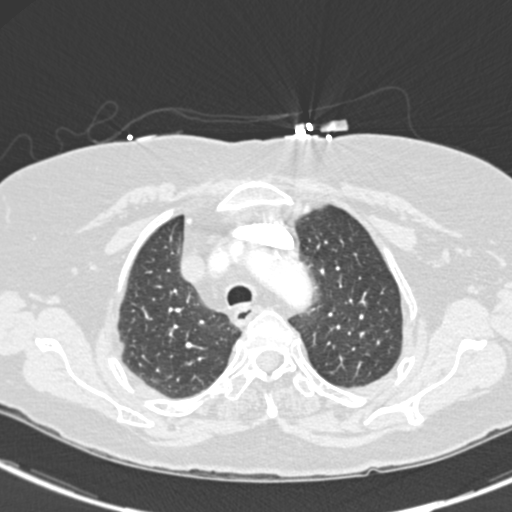
[im 206/247  mediastinal]
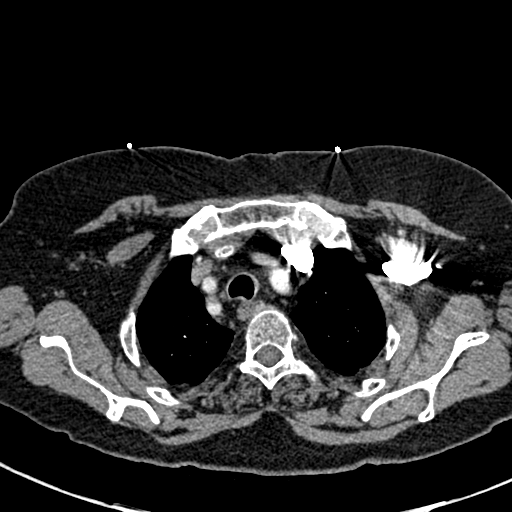
[im 219/247  lung]
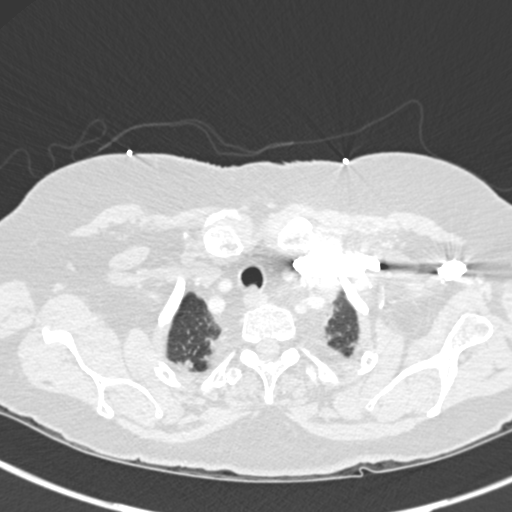
[im 233/247  mediastinal]
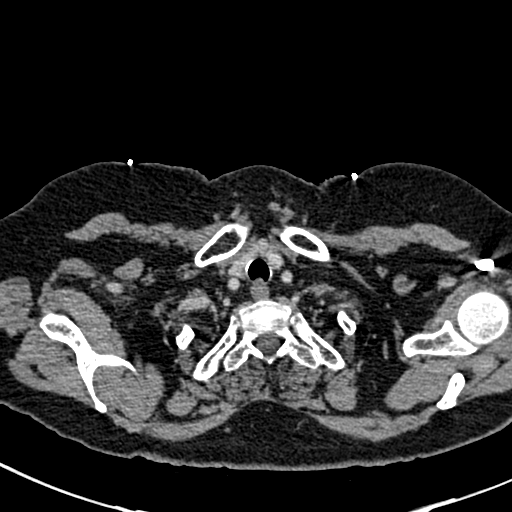

[18 of 30 positions shown; findings below may reference images not displayed]

FINDINGS: No evidence of pulmonary embolism.

Mediastinum/Nodes: Cardiomegaly.  No pericardial effusion.

Coronary atherosclerosis in the LAD and right main coronary artery.

Atherosclerotic calcifications of the aortic arch.

5 mm short axis right paratracheal node (series 4/image 20). 10 mm
short axis node in the right azygoesophageal recess (series 4/image
43). No appreciable hypermetabolism on prior PET.

Lungs/Pleura: 3.4 x 4.5 x 4.2 cm right lower lobe mass which abuts
the right major fissure. Minimal adjacent satellite nodularity
(series 8/ image 43).

Mild dependent atelectasis in the bilateral lower lobes.

Mild paraseptal emphysematous changes.

No pleural effusion or pneumothorax.

Upper abdomen: Visualized upper abdomen is unremarkable.

Musculoskeletal: Mild degenerative changes of the thoracic spine.

Review of the MIP images confirms the above findings.
IMPRESSION: No evidence of pulmonary embolism.

4.5 cm right lower lobe mass which abuts the right major fissure,
compatible with known primary bronchogenic neoplasm.

No evidence of metastatic disease in the chest.

## 2015-11-09 IMAGING — US IR FLUORO GUIDE CV LINE*R*
1 series · 2 of 2 positions shown · non-contrast
Comparison: none

CLINICAL DATA: Right lower lobe lung carcinoma and need for porta
cath to begin chemotherapy.

[Series 1: sp fluoro guide cv line*left* · 2 of 2 slices shown]
[im 1/2]
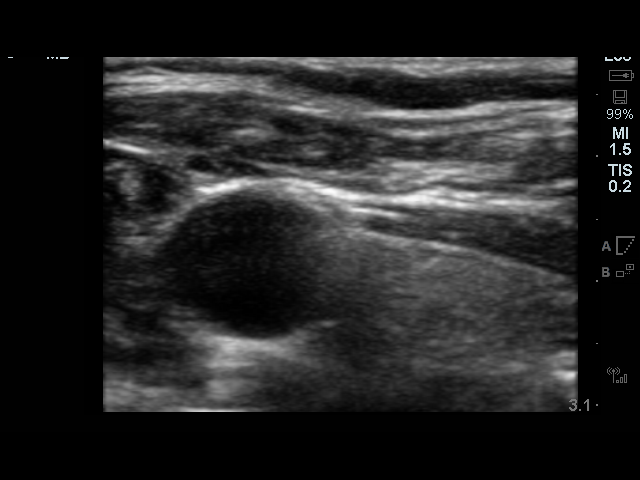
[im 2/2]
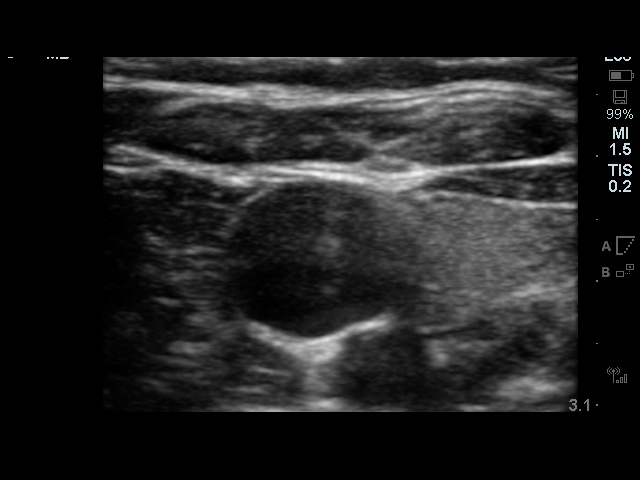

[2 of 2 positions shown; findings below may reference images not displayed]

EXAM:
IMPLANTED PORT A CATH PLACEMENT WITH ULTRASOUND AND FLUOROSCOPIC
GUIDANCE

ANESTHESIA/SEDATION:
2.0 Mg IV Versed; 100 mcg IV Fentanyl

Total Moderate Sedation Time:  29 minutes

Additional Medications: 2 g IV Ancef. As antibiotic prophylaxis,
Ancef was ordered pre-procedure and administered intravenously
within one hour of incision.

FLUOROSCOPY TIME:  12 seconds.

PROCEDURE:
The procedure, risks, benefits, and alternatives were explained to
the patient. Questions regarding the procedure were encouraged and
answered. The patient understands and consents to the procedure.

The right neck and chest were prepped with chlorhexidine in a
sterile fashion, and a sterile drape was applied covering the
operative field. Maximum barrier sterile technique with sterile
gowns and gloves were used for the procedure. Local anesthesia was
provided with 1% lidocaine. Prior to the procedure a time-out was
performed.

Ultrasound was used to confirm patency of the right internal jugular
vein. After creating a small venotomy incision, a 21 gauge needle
was advanced into the right internal jugular vein under direct,
real-time ultrasound guidance. Ultrasound image documentation was
performed. After securing guidewire access, an 8 Fr dilator was
placed. A J-wire was kinked to measure appropriate catheter length.

A subcutaneous port pocket was then created along the upper chest
wall utilizing sharp and blunt dissection. Portable cautery was
utilized. The pocket was irrigated with sterile saline.

A single lumen power injectable port was chosen for placement. The 8
Fr catheter was tunneled from the port pocket site to the venotomy
incision. The port was placed in the pocket. External catheter was
trimmed to appropriate length based on guidewire measurement.

At the venotomy, an 8 Fr peel-away sheath was placed over a
guidewire. The catheter was then placed through the sheath and the
sheath removed. Final catheter positioning was confirmed and
documented with a fluoroscopic spot image. The port was accessed
with a needle and aspirated and flushed with heparinized saline. The
needle was removed.

The venotomy and port pocket incisions were closed with subcutaneous
3-0 Monocryl and subcuticular 4-0 Vicryl. Dermabond was applied to
both incisions.

COMPLICATIONS:
None
FINDINGS: After catheter placement, the tip lies at the cavoatrial junction.
The catheter aspirates normally and is ready for immediate use.
IMPRESSION: Placement of single lumen port a cath via right internal jugular
vein. The catheter tip lies at the cavoatrial junction. A power
injectable port a cath was placed and is ready for immediate use.

## 2018-08-10 ENCOUNTER — Other Ambulatory Visit: Payer: Self-pay | Admitting: *Deleted
